# Patient Record
Sex: Female | Born: 1975 | Race: Black or African American | Hispanic: Yes | Marital: Married | Smoking: Former smoker
Health system: Southern US, Community
[De-identification: ages and names within clinical notes are randomized; demographics above are authoritative.]

## PROBLEM LIST (undated history)

## (undated) DIAGNOSIS — F419 Anxiety disorder, unspecified: Secondary | ICD-10-CM

## (undated) DIAGNOSIS — M51369 Other intervertebral disc degeneration, lumbar region without mention of lumbar back pain or lower extremity pain: Secondary | ICD-10-CM

## (undated) DIAGNOSIS — E559 Vitamin D deficiency, unspecified: Secondary | ICD-10-CM

## (undated) DIAGNOSIS — T7840XA Allergy, unspecified, initial encounter: Secondary | ICD-10-CM

## (undated) DIAGNOSIS — M1990 Unspecified osteoarthritis, unspecified site: Secondary | ICD-10-CM

## (undated) DIAGNOSIS — J45909 Unspecified asthma, uncomplicated: Secondary | ICD-10-CM

## (undated) DIAGNOSIS — R32 Unspecified urinary incontinence: Secondary | ICD-10-CM

## (undated) DIAGNOSIS — A64 Unspecified sexually transmitted disease: Secondary | ICD-10-CM

## (undated) DIAGNOSIS — F32A Depression, unspecified: Secondary | ICD-10-CM

## (undated) DIAGNOSIS — Z8742 Personal history of other diseases of the female genital tract: Secondary | ICD-10-CM

## (undated) DIAGNOSIS — K219 Gastro-esophageal reflux disease without esophagitis: Secondary | ICD-10-CM

## (undated) DIAGNOSIS — M5136 Other intervertebral disc degeneration, lumbar region: Secondary | ICD-10-CM

## (undated) HISTORY — DX: Unspecified urinary incontinence: R32

## (undated) HISTORY — PX: REDUCTION MAMMAPLASTY: SUR839

## (undated) HISTORY — DX: Personal history of other diseases of the female genital tract: Z87.42

## (undated) HISTORY — PX: COSMETIC SURGERY: SHX468

## (undated) HISTORY — DX: Vitamin D deficiency, unspecified: E55.9

## (undated) HISTORY — DX: Unspecified sexually transmitted disease: A64

## (undated) HISTORY — DX: Other intervertebral disc degeneration, lumbar region without mention of lumbar back pain or lower extremity pain: M51.369

## (undated) HISTORY — DX: Unspecified asthma, uncomplicated: J45.909

## (undated) HISTORY — DX: Anxiety disorder, unspecified: F41.9

## (undated) HISTORY — DX: Unspecified osteoarthritis, unspecified site: M19.90

## (undated) HISTORY — DX: Other intervertebral disc degeneration, lumbar region: M51.36

## (undated) HISTORY — DX: Depression, unspecified: F32.A

## (undated) HISTORY — PX: BREAST SURGERY: SHX581

## (undated) HISTORY — DX: Gastro-esophageal reflux disease without esophagitis: K21.9

---

## 1993-07-17 DIAGNOSIS — A64 Unspecified sexually transmitted disease: Secondary | ICD-10-CM

## 1993-07-17 HISTORY — DX: Unspecified sexually transmitted disease: A64

## 2003-07-18 HISTORY — PX: INDUCED ABORTION: SHX677

## 2011-07-18 HISTORY — PX: OTHER SURGICAL HISTORY: SHX169

## 2011-08-29 ENCOUNTER — Emergency Department (HOSPITAL_COMMUNITY)
Admission: EM | Admit: 2011-08-29 | Discharge: 2011-08-30 | Disposition: A | Discharge status: Home / Self Care | Payer: Self-pay | Attending: Emergency Medicine | Admitting: Emergency Medicine

## 2011-08-29 ENCOUNTER — Encounter (HOSPITAL_COMMUNITY): Payer: Self-pay | Admitting: *Deleted

## 2011-08-29 ENCOUNTER — Emergency Department (HOSPITAL_COMMUNITY)
Admission: EM | Admit: 2011-08-29 | Discharge: 2011-08-29 | Discharge status: Against Medical Advice | Payer: Self-pay | Attending: Emergency Medicine | Admitting: Emergency Medicine

## 2011-08-29 ENCOUNTER — Emergency Department (HOSPITAL_COMMUNITY): Payer: Self-pay

## 2011-08-29 DIAGNOSIS — W260XXA Contact with knife, initial encounter: Secondary | ICD-10-CM | POA: Insufficient documentation

## 2011-08-29 DIAGNOSIS — S61209A Unspecified open wound of unspecified finger without damage to nail, initial encounter: Secondary | ICD-10-CM | POA: Insufficient documentation

## 2011-08-29 DIAGNOSIS — Y93G1 Activity, food preparation and clean up: Secondary | ICD-10-CM | POA: Insufficient documentation

## 2011-08-29 DIAGNOSIS — Y92009 Unspecified place in unspecified non-institutional (private) residence as the place of occurrence of the external cause: Secondary | ICD-10-CM | POA: Insufficient documentation

## 2011-08-29 DIAGNOSIS — S61419A Laceration without foreign body of unspecified hand, initial encounter: Secondary | ICD-10-CM

## 2011-08-29 DIAGNOSIS — W261XXA Contact with sword or dagger, initial encounter: Secondary | ICD-10-CM | POA: Insufficient documentation

## 2011-08-29 DIAGNOSIS — S61409A Unspecified open wound of unspecified hand, initial encounter: Secondary | ICD-10-CM | POA: Insufficient documentation

## 2011-08-29 MED ORDER — ONDANSETRON HCL 4 MG/2ML IJ SOLN
4.0000 mg | Freq: Once | INTRAMUSCULAR | Status: AC
  Administered 2011-08-29: 4 mg via INTRAVENOUS

## 2011-08-29 MED ORDER — LIDOCAINE-EPINEPHRINE (PF) 1 %-1:200000 IJ SOLN
INTRAMUSCULAR | Status: AC
  Filled 2011-08-29: qty 10

## 2011-08-29 MED ORDER — HYDROMORPHONE HCL PF 1 MG/ML IJ SOLN
1.0000 mg | Freq: Once | INTRAMUSCULAR | Status: AC
  Administered 2011-08-29: 1 mg via INTRAVENOUS
  Filled 2011-08-29: qty 1

## 2011-08-29 MED ORDER — ONDANSETRON HCL 4 MG/2ML IJ SOLN
INTRAMUSCULAR | Status: AC
  Filled 2011-08-29: qty 2

## 2011-08-29 MED ORDER — SODIUM CHLORIDE 0.9 % IV SOLN
Freq: Once | INTRAVENOUS | Status: AC
  Administered 2011-08-29: 22:00:00 via INTRAVENOUS

## 2011-08-29 NOTE — ED Notes (Signed)
Bed:WA23<BR> Expected date:<BR> Expected time:<BR> Means of arrival:<BR> Comments:<BR> Hold for triage

## 2011-08-29 NOTE — ED Notes (Signed)
Lac to the lt ring finger with a knife while cooking dinner.  Minimal bleeding at present

## 2011-08-29 NOTE — ED Notes (Signed)
Pt in c/o laceration to left hand, pt with saturated bandage, laceration to left ring finger, dressing changed in triage with pressure applied, IV initiated, with NP at bedside

## 2011-08-30 LAB — BASIC METABOLIC PANEL
BUN: 13 mg/dL (ref 6–23)
CO2: 24 mEq/L (ref 19–32)
Calcium: 9 mg/dL (ref 8.4–10.5)
Chloride: 102 mEq/L (ref 96–112)
Creatinine, Ser: 0.51 mg/dL (ref 0.50–1.10)
GFR calc Af Amer: >90 mL/min (ref >90)
GFR calc non Af Amer: >90 mL/min (ref >90)
Glucose, Bld: 150 mg/dL — ABNORMAL HIGH (ref 70–99)
Potassium: 4 mEq/L (ref 3.5–5.1)
Sodium: 134 mEq/L — ABNORMAL LOW (ref 135–145)

## 2011-08-30 LAB — CBC
HCT: 37.2 % (ref 36.0–46.0)
Hemoglobin: 13.1 g/dL (ref 12.0–15.0)
MCH: 32.9 pg (ref 26.0–34.0)
MCHC: 35.2 g/dL (ref 30.0–36.0)
MCV: 93.5 fL (ref 78.0–100.0)
Platelets: 203 10*3/uL (ref 150–400)
RBC: 3.98 MIL/uL (ref 3.87–5.11)
RDW: 12 % (ref 11.5–15.5)
WBC: 9.6 10*3/uL (ref 4.0–10.5)

## 2011-08-30 LAB — DIFFERENTIAL
Basophils Absolute: 0.1 10*3/uL (ref 0.0–0.1)
Basophils Relative: 1 % (ref 0–1)
Eosinophils Absolute: 0.6 10*3/uL (ref 0.0–0.7)
Eosinophils Relative: 6 % — ABNORMAL HIGH (ref 0–5)
Lymphocytes Relative: 41 % (ref 12–46)
Lymphs Abs: 3.9 10*3/uL (ref 0.7–4.0)
Monocytes Absolute: 0.7 10*3/uL (ref 0.1–1.0)
Monocytes Relative: 7 % (ref 3–12)
Neutro Abs: 4.4 10*3/uL (ref 1.7–7.7)
Neutrophils Relative %: 46 % (ref 43–77)

## 2011-08-30 MED ORDER — OXYCODONE HCL 5 MG PO CAPS: 10.0000 mg | ORAL_CAPSULE | ORAL | Status: DC | PRN

## 2011-08-30 MED ORDER — TETANUS-DIPHTH-ACELL PERTUSSIS 5-2.5-18.5 LF-MCG/0.5 IM SUSP
INTRAMUSCULAR | Status: AC
  Administered 2011-08-30: 0.5 mL via INTRAMUSCULAR
  Filled 2011-08-30: qty 0.5

## 2011-08-30 MED ORDER — CEFAZOLIN SODIUM 1-5 GM-% IV SOLN
1.0000 g | Freq: Once | INTRAVENOUS | Status: AC
  Administered 2011-08-30: 1 g via INTRAVENOUS
  Filled 2011-08-30: qty 50

## 2011-08-30 MED ORDER — CEPHALEXIN 500 MG PO CAPS: 500.0000 mg | ORAL_CAPSULE | Freq: Four times a day (QID) | ORAL | Status: AC

## 2011-08-30 MED ORDER — TETANUS-DIPHTH-ACELL PERTUSSIS 5-2.5-18.5 LF-MCG/0.5 IM SUSP: 0.5000 mL | Freq: Once | INTRAMUSCULAR | Status: DC

## 2011-08-30 NOTE — ED Provider Notes (Signed)
History     CSN: [id number]  Arrival date & time 08/29/11  2134   First MD Initiated Contact with Patient 08/29/11 2154      Chief Complaint  Patient presents with  . Extremity Laceration    HPI: Patient is a 36 y.o. female presenting with skin laceration. The history is provided by the patient.  Laceration  The incident occurred 1 to 2 hours ago. The laceration is located on the left hand. The laceration is 2 cm in size. The laceration mechanism was a a clean knife. The pain is at a severity of 9/10. The pain is severe. The pain has been constant since onset.  Pt states she was cutting vegetables with a very sharp at home when the knife slipped and she cut her hand between her 3rd and 4th (L) fingers. Bleeding has been persistent.  History reviewed. No pertinent past medical history.  History reviewed. No pertinent past surgical history.  History reviewed. No pertinent family history.  History  Substance Use Topics  . Smoking status: Never Smoker   . Smokeless tobacco: Not on file  . Alcohol Use: No    OB History    Grav Para Term Preterm Abortions TAB SAB Ect Mult Living                  Review of Systems  Constitutional: Negative.   HENT: Negative.   Eyes: Negative.   Respiratory: Negative.   Cardiovascular: Negative.   Gastrointestinal: Negative.   Genitourinary: Negative.   Musculoskeletal: Negative.   Skin: Negative.   Neurological: Negative.   Hematological: Negative.   Psychiatric/Behavioral: Negative.     Allergies  Review of patient's allergies indicates no known allergies.  Home Medications   Current Outpatient Rx  Name Route Sig Dispense Refill  . EVENING PRIMROSE OIL PO CAPS Oral Take 1 capsule by mouth daily.    Marland Kitchen FEXOFENADINE HCL 180 MG PO TABS Oral Take 180 mg by mouth daily.    . MELOXICAM 7.5 MG PO TABS Oral Take 7.5 mg by mouth daily.      BP 106/71  Pulse 65  Temp(Src) 99.2 F (37.3 C) (Oral)  Resp 16  SpO2 100%  LMP  08/19/2011  Physical Exam  Constitutional: She is oriented to person, place, and time. She appears well-developed and well-nourished.  HENT:  Head: Normocephalic and atraumatic.  Eyes: Conjunctivae are normal.  Neck: Neck supple.  Cardiovascular: Normal rate and regular rhythm.   Pulmonary/Chest: Effort normal and breath sounds normal.  Abdominal: Soft. Bowel sounds are normal.  Musculoskeletal: Normal range of motion.       Hands:      Deep laceration at base of proximal (L) 4th finger w/ small persistent arterial bleed originating from proximal aspect of the wound. Pt flexes and extends finger well.   Neurological: She is alert and oriented to person, place, and time.  Skin: Skin is warm and dry. No erythema.  Psychiatric: She has a normal mood and affect.    ED Course  Procedures  Pressure dressing reapplied. Will xray given depth of wound. Pt has been medicated for pain.   Xray negative for fx or fb. Assessed wound to prepare for suturing. Small arterial bleed still noted. Discussed pt w/ Dr Manus Gunning who has also seen and examined pt. Dr Manus Gunning will consult with hand regarding plan 0130: 3rd pressure dressing removed, bleeding has slowed but continues. Per Dr Carlos Levering request saline soaked guaze applied to wound then secured w/  2 inch ace wrap.  0200:  Dr Amanda Pea is in the department ro eval pt. . Has discussed pt w/ Dr Manus Gunning.    Labs Reviewed - No data to display Dg Hand Complete Left  08/29/2011  *RADIOLOGY REPORT*  Clinical Data: Laceration to left ring finger.  Heavy bleeding.  LEFT HAND - COMPLETE 3+ VIEW  Comparison: None.  Findings: Soft tissue laceration demonstrated with gauze over the base of the left fourth finger.  No associated bony injuries demonstrated.  No evidence of acute fracture or subluxation.  No focal periosteal reaction.  Bone cortex and trabecular architecture appear intact.  No radiopaque foreign bodies demonstrated although smaller foreign bodies could  be obscured by the gauze.  Left hand is otherwise unremarkable.  IMPRESSION: Soft tissue laceration to the proximal aspect of the left fourth finger.  No acute bony injury demonstrated.  Original Report Authenticated By: Marlon Pel, M.D.     No diagnosis found.    MDM  HPI/PE and clinical findings c/w Laceration to proximal aspect of 4th digit w/ possible vascular/nerve compromise Dr Amanda Pea in to eval pt        Leanne Chang, NP 08/31/11 1042

## 2011-08-30 NOTE — Discharge Summary (Signed)
  See consult note Tamberlyn Midgley MD 

## 2011-08-30 NOTE — Consult Note (Signed)
Reason for Consult laceration left ring finger  Referring Physician: ED Staff  Michelle Beltran is an 37 y.o. female.  HPI: 36 year old female status post laceration left ring finger base concerns. I was asked to see and treat her for her upper extremity predicament. The injury occurred at 7:30 PM. I was asked to see her after  1:30 a.Beltran. 08/30/2011. Notes no pain. She has been given a digital block. I discussed this with emergency room staff and they noted that she had some numbness in her ulna digital nerve. Marland Kitchen.Patient presents for evaluation and treatment of the of their upper extremity predicament. The patient denies neck back chest or of abdominal pain. The patient notes that they have no lower extremity problems. The patient from primarily complains of the upper extremity pain noted.  History reviewed. No pertinent past medical history.  History reviewed. No pertinent past surgical history.  History reviewed. No pertinent family history.  Social History:  reports that she has never smoked. She does not have any smokeless tobacco history on file. She reports that she does not drink alcohol. Her drug history not on file.  Allergies: No Known Allergies  Medications: I have reviewed the patient's current medications.  Results for orders placed during the hospital encounter of 08/29/11 (from the past 48 hour(s))  CBC     Status: Normal   Collection Time   08/29/11  9:45 PM      Component Value Range Comment   WBC 9.6  4.0 - 10.5 (K/uL)    RBC 3.98  3.87 - 5.11 (MIL/uL)    Hemoglobin 13.1  12.0 - 15.0 (g/dL)    HCT 16.1  09.6 - 04.5 (%)    MCV 93.5  78.0 - 100.0 (fL)    MCH 32.9  26.0 - 34.0 (pg)    MCHC 35.2  30.0 - 36.0 (g/dL)    RDW 40.9  81.1 - 91.4 (%)    Platelets 203  150 - 400 (K/uL)   DIFFERENTIAL     Status: Abnormal   Collection Time   08/29/11  9:45 PM      Component Value Range Comment   Neutrophils Relative 46  43 - 77 (%)    Neutro Abs 4.4  1.7 - 7.7 (K/uL)    Lymphocytes Relative 41  12 - 46 (%)    Lymphs Abs 3.9  0.7 - 4.0 (K/uL)    Monocytes Relative 7  3 - 12 (%)    Monocytes Absolute 0.7  0.1 - 1.0 (K/uL)    Eosinophils Relative 6 (*) 0 - 5 (%)    Eosinophils Absolute 0.6  0.0 - 0.7 (K/uL)    Basophils Relative 1  0 - 1 (%)    Basophils Absolute 0.1  0.0 - 0.1 (K/uL)     Dg Hand Complete Left  08/29/2011  *RADIOLOGY REPORT*  Clinical Data: Laceration to left ring finger.  Heavy bleeding.  LEFT HAND - COMPLETE 3+ VIEW  Comparison: None.  Findings: Soft tissue laceration demonstrated with gauze over the base of the left fourth finger.  No associated bony injuries demonstrated.  No evidence of acute fracture or subluxation.  No focal periosteal reaction.  Bone cortex and trabecular architecture appear intact.  No radiopaque foreign bodies demonstrated although smaller foreign bodies could be obscured by the gauze.  Left hand is otherwise unremarkable.  IMPRESSION: Soft tissue laceration to the proximal aspect of the left fourth finger.  No acute bony injury demonstrated.  Original Report Authenticated By:  Marlon Pel, Beltran.D.    Review of Systems  Constitutional: Negative.   HENT: Negative.   Respiratory: Negative.   Cardiovascular: Negative.   Genitourinary: Negative.   Neurological: Negative.   Psychiatric/Behavioral: Negative.    Blood pressure 106/71, pulse 65, temperature 99.2 F (37.3 C), temperature source Oral, resp. rate 16, last menstrual period 08/19/2011, SpO2 100.00%. Physical Exam: The patient has a laceration at the base of the left ring finger with intact flexor tendon function and normal refill to the tip of the finger. Marland Kitchen.The patient is alert and oriented in no acute distress the patient complains of pain in the affected upper extremity. The patient is noted to have a normal HEENT exam. Lung fields show equal chest expansion and no shortness of breath abdomen exam is nontender without distention. Lower extremity examination  does not show any fracture dislocation or blood clot symptoms. Pelvis is stable neck and back are stable and nontender  Assessment/Plan:Marland Kitchen.We are planning surgery for your upper extremity. The risk and benefits of surgery include risk of bleeding infection anesthesia damage to normal structures and failure of the surgery to accomplish its intended goals of relieving symptoms and restoring function with this in mind we'll going to proceed. I have specifically discussed with the patient the pre-and postoperative regime and the does and don'ts and risk and benefits in great detail. Risk and benefits of surgery also include risk of dystrophy chronic nerve pain failure of the healing process to go onto completion and other inherent risks of surgery The relavent the pathophysiology of the disease/injury process, as well as the alternatives for treatment and postoperative course of action has been discussed in great detail with the patient who desires to proceed.  We will do everything in our power to help you (the patient) restore function to the upper extremity. Is a pleasure to see this patient today.  We have discussed with the patient all issues. Was taken and underwent irrigation and debridement on followed by of the wound. I discussed with her that I would recommend repeat examination so that I can ascertain whether she has a complete digital nerve the nerve is lacerated I would recommend formal operative reconstruction to prevent a painful neuroma in the future. Yesterday with her these issues at length and with her husband. Plans to discharge her on antibiotics and pain medicine. I will see her back in the office in 48 hours for reexamination. At that time if her nerve he is lacerated recommend formal repair in the operative arena. I discussed with her the risk and benefits of this and timeframe duration of recovery. I discussed with her that I would recommend repair to prevent a painful neuroma which can be  problematic into the future. Stands his. She tolerated the irrigation and debridement nicely today and will closure. She had excellent refill no complicating features. I will see back in my office in less than 48 hours and I would like to personally examined her finger for a look at the ulnar digital nerve in question.  Michelle Beltran,Michelle Beltran 08/30/2011, 2:26 AM

## 2011-08-30 NOTE — Discharge Instructions (Signed)

## 2011-08-31 NOTE — ED Provider Notes (Signed)
Medical screening examination/treatment/procedure(s) were conducted as a shared visit with non-physician practitioner(s) and myself.  I personally evaluated the patient during the encounter  Transverse laceration to base of fourth digit on left.  FDS and FDP intact, held in flexed position. +2 radial pulse, cap refill less than 2 secs.   Active arterial bleeding proximal to laceration in palm of hand.  Unable to visualize source. Bleeding continues despite pressure.  D/w hand surgeon Dr. Amanda Pea.  Glynn Octave, MD 08/31/11 [phone]

## 2011-09-01 ENCOUNTER — Encounter (HOSPITAL_COMMUNITY): Payer: Self-pay | Admitting: Pharmacy Technician

## 2011-09-05 ENCOUNTER — Encounter (HOSPITAL_COMMUNITY): Admission: RE | Payer: Self-pay | Source: Ambulatory Visit

## 2011-09-05 ENCOUNTER — Ambulatory Visit (HOSPITAL_COMMUNITY): Admission: RE | Admit: 2011-09-05 | Payer: Self-pay | Source: Ambulatory Visit | Admitting: Orthopedic Surgery

## 2011-09-05 SURGERY — IRRIGATION AND DEBRIDEMENT EXTREMITY
Anesthesia: General | Laterality: Left

## 2012-06-07 ENCOUNTER — Emergency Department (HOSPITAL_COMMUNITY)
Admission: EM | Admit: 2012-06-07 | Discharge: 2012-06-07 | Disposition: A | Discharge status: Home / Self Care | Payer: Self-pay | Attending: Emergency Medicine | Admitting: Emergency Medicine

## 2012-06-07 ENCOUNTER — Encounter (HOSPITAL_COMMUNITY): Payer: Self-pay | Admitting: *Deleted

## 2012-06-07 DIAGNOSIS — T7840XA Allergy, unspecified, initial encounter: Secondary | ICD-10-CM

## 2012-06-07 DIAGNOSIS — T498X5A Adverse effect of other topical agents, initial encounter: Secondary | ICD-10-CM | POA: Insufficient documentation

## 2012-06-07 DIAGNOSIS — J45901 Unspecified asthma with (acute) exacerbation: Secondary | ICD-10-CM

## 2012-06-07 DIAGNOSIS — Z79899 Other long term (current) drug therapy: Secondary | ICD-10-CM | POA: Insufficient documentation

## 2012-06-07 HISTORY — DX: Allergy, unspecified, initial encounter: T78.40XA

## 2012-06-07 MED ORDER — ALBUTEROL SULFATE HFA 108 (90 BASE) MCG/ACT IN AERS: 2.0000 | INHALATION_SPRAY | RESPIRATORY_TRACT | Status: DC | PRN

## 2012-06-07 MED ORDER — ALBUTEROL SULFATE (5 MG/ML) 0.5% IN NEBU
5.0000 mg | INHALATION_SOLUTION | Freq: Once | RESPIRATORY_TRACT | Status: AC
  Administered 2012-06-07: 5 mg via RESPIRATORY_TRACT
  Filled 2012-06-07: qty 1

## 2012-06-07 MED ORDER — EPINEPHRINE 0.3 MG/0.3ML IJ DEVI: 0.3000 mg | INTRAMUSCULAR | Status: DC | PRN

## 2012-06-07 MED ORDER — FAMOTIDINE 20 MG PO TABS: 40.0000 mg | ORAL_TABLET | Freq: Two times a day (BID) | ORAL | Status: DC

## 2012-06-07 MED ORDER — IPRATROPIUM BROMIDE 0.02 % IN SOLN
0.5000 mg | Freq: Once | RESPIRATORY_TRACT | Status: AC
  Administered 2012-06-07: 0.5 mg via RESPIRATORY_TRACT
  Filled 2012-06-07: qty 2.5

## 2012-06-07 MED ORDER — PREDNISONE 20 MG PO TABS: ORAL_TABLET | ORAL | Status: DC

## 2012-06-07 NOTE — ED Notes (Addendum)
Per ems pt is from home. Alert and oriented x4, ambulatory. Pt reports 1445 pt started having wheezing, sob, hypotension, hives on face. Pt has hx of allergic reaction. ems believes pts family tried to use [facility], but ems unsure if pt actually received medication.  18 g R wrist. ems gave 0.3 epi, 125 mg solumedrol, 50 mg benadryl, 50 mg zantac, 5 albuterol.    Upon arrival to ED pt reported she needed to have a bowel movement. Stayed in bathroom for at least 5 min. Pt had bowel movement, but now reports "her intestines hurts" 3/10.  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 Pt is taking a weekly serum to build up immunity to allergies. Pt took serum today and then started having allergic reaction.

## 2012-06-07 NOTE — ED Provider Notes (Signed)
History     CSN: [id number]  Arrival date & time 06/07/12  1519   First MD Initiated Contact with Patient 06/07/12 1551      Chief Complaint  Patient presents with  . Allergic Reaction    (Consider location/radiation/quality/duration/timing/severity/associated sxs/prior treatment) HPI  Patient reports she's been getting allergy shots for the past 6-7 months and relates her dose was changed about 4 weeks ago. She relates she took her allergy shot today at 220 and immediately started feeling short of breath and having wheezing. She states she had midline chest pain that was described as burning that she's never had before. She denies any itching or rash, no swelling of her face or throat. She states her face felt hot and her arms felt hot. She states she's never had this happen before. She presents via EMS and she received epi 0.3 cc IM, Solu-Medrol 125 mg IV Benadryl 50 mg IV Zantac 50 mg IV and albuterol nebulizer 5 mg. She states currently she has mild shortness of breath and still has some wheezing.  She reports she has to use her inhaler at least weekly for wheezing.  Allergist Dr. Marily Memos at [facility]  Past Medical History  Diagnosis Date  . Allergy     dogs and cats    No past surgical history on file.  No family history on file.  History  Substance Use Topics  . Smoking status: yes  . Smokeless tobacco: Not on file  . Alcohol Use: No  college student  OB History    Grav Para Term Preterm Abortions TAB SAB Ect Mult Living                  Review of Systems  All other systems reviewed and are negative.    Allergies  Cat hair extract  Home Medications   Current Outpatient Rx  Name  Route  Sig  Dispense  Refill  . ALBUTEROL SULFATE HFA 108 (90 BASE) MCG/ACT IN AERS   Inhalation   Inhale 2 puffs into the lungs every 4 (four) hours as needed for wheezing.   1 Inhaler   0   . EPINEPHRINE 0.3 MG/0.3ML IJ DEVI   Intramuscular   Inject 0.3  mLs (0.3 mg total) into the muscle as needed.   2 Device   0     BP 123/79  Pulse 100  Temp 98.5 F (36.9 C) (Oral)  Resp 16  SpO2 96%  Vital signs normal except tachycardia   Physical Exam  Nursing note and vitals reviewed. Constitutional: She is oriented to person, place, and time. She appears well-developed and well-nourished.  Non-toxic appearance. She does not appear ill. No distress.       Pt stares at me  HENT:  Head: Normocephalic and atraumatic.  Right Ear: External ear normal.  Left Ear: External ear normal.  Nose: Nose normal. No mucosal edema or rhinorrhea.  Mouth/Throat: Oropharynx is clear and moist and mucous membranes are normal. No dental abscesses or uvula swelling.  Eyes: Conjunctivae normal and EOM are normal. Pupils are equal, round, and reactive to light.  Neck: Normal range of motion and full passive range of motion without pain. Neck supple.  Cardiovascular: Normal rate, regular rhythm and normal heart sounds.  Exam reveals no gallop and no friction rub.   No murmur heard. Pulmonary/Chest: Effort normal. No respiratory distress. She has wheezes. She has no rhonchi. She has no rales. She exhibits no tenderness and no crepitus.  Has faint diffuse expir wheezes, no retractions  Abdominal: Soft. Normal appearance and bowel sounds are normal. She exhibits no distension. There is no tenderness. There is no rebound and no guarding.  Musculoskeletal: Normal range of motion. She exhibits no edema and no tenderness.       Moves all extremities well.   Neurological: She is alert and oriented to person, place, and time. She has normal strength. No cranial nerve deficit.  Skin: Skin is warm and intact. No rash noted. She is diaphoretic. No erythema. No pallor.  Psychiatric: She has a normal mood and affect. Her speech is normal and behavior is normal. Her mood appears not anxious.    ED Course  Procedures (including critical care time)   Medications    ipratropium (ATROVENT) nebulizer solution 0.5 mg (0.5 mg Nebulization Given 06/07/12 1624)  albuterol (PROVENTIL) (5 MG/ML) 0.5% nebulizer solution 5 mg (5 mg Nebulization Given 06/07/12 1624)     17:10 pt is feeling better, states her wheezing is gone, on my exam there is good air movement and the wheezing is gone. We have discussed talking to her allergist before she takes her next allergy injection.    1. Allergic reaction   2. Acute asthma exacerbation    New Prescriptions   ALBUTEROL (PROVENTIL HFA;VENTOLIN HFA) 108 (90 BASE) MCG/ACT INHALER    Inhale 2 puffs into the lungs every 4 (four) hours as needed for wheezing.   EPINEPHRINE (EPIPEN) 0.3 MG/0.3 ML DEVI    Inject 0.3 mLs (0.3 mg total) into the muscle as needed.   FAMOTIDINE (PEPCID) 20 MG TABLET    Take 2 tablets (40 mg total) by mouth 2 (two) times daily.   PREDNISONE (DELTASONE) 20 MG TABLET    Take 3 po QD x 2d starting tomorrow, then 2 po QD x 3d then 1 po QD x 3d    Plan discharge  Devoria Albe, MD, Armando Gang    MDM          Ward Givens, MD 06/07/12 1723

## 2012-06-07 NOTE — ED Notes (Signed)
RT called for breathing treatment.

## 2012-06-07 NOTE — ED Notes (Signed)
Pt escorted to discharge window. Pt verbalized understanding discharge instructions. In no acute distress.  

## 2012-11-14 ENCOUNTER — Other Ambulatory Visit: Payer: Self-pay | Admitting: *Deleted

## 2012-11-14 DIAGNOSIS — N83209 Unspecified ovarian cyst, unspecified side: Secondary | ICD-10-CM

## 2012-11-18 ENCOUNTER — Other Ambulatory Visit: Payer: Self-pay

## 2015-02-09 ENCOUNTER — Encounter (HOSPITAL_COMMUNITY): Payer: Self-pay | Admitting: *Deleted

## 2016-04-17 ENCOUNTER — Ambulatory Visit (INDEPENDENT_AMBULATORY_CARE_PROVIDER_SITE_OTHER): Payer: BLUE CROSS/BLUE SHIELD | Admitting: Obstetrics and Gynecology

## 2016-04-17 ENCOUNTER — Encounter: Payer: Self-pay | Admitting: Obstetrics and Gynecology

## 2016-04-17 VITALS — BP 100/72 | HR 62 | Resp 14 | Ht 68.5 in | Wt 160.4 lb

## 2016-04-17 DIAGNOSIS — Z8742 Personal history of other diseases of the female genital tract: Secondary | ICD-10-CM

## 2016-04-17 DIAGNOSIS — Z01419 Encounter for gynecological examination (general) (routine) without abnormal findings: Secondary | ICD-10-CM

## 2016-04-17 LAB — TSH: TSH: 1.11 mIU/L

## 2016-04-17 LAB — COMPREHENSIVE METABOLIC PANEL
ALT: 6 U/L (ref 6–29)
AST: 15 U/L (ref 10–30)
Albumin: 4.1 g/dL (ref 3.6–5.1)
Alkaline Phosphatase: 59 U/L (ref 33–115)
BUN: 11 mg/dL (ref 7–25)
CO2: 24 mmol/L (ref 20–31)
Calcium: 9.3 mg/dL (ref 8.6–10.2)
Chloride: 104 mmol/L (ref 98–110)
Creat: 0.57 mg/dL (ref 0.50–1.10)
Glucose, Bld: 57 mg/dL — ABNORMAL LOW (ref 65–99)
Potassium: 4 mmol/L (ref 3.5–5.3)
Sodium: 138 mmol/L (ref 135–146)
Total Bilirubin: 0.5 mg/dL (ref 0.2–1.2)
Total Protein: 6.6 g/dL (ref 6.1–8.1)

## 2016-04-17 LAB — LIPID PANEL
Cholesterol: 173 mg/dL (ref 125–200)
HDL: 70 mg/dL (ref >=46)
LDL Cholesterol: 86 mg/dL (ref <130)
Total CHOL/HDL Ratio: 2.5 Ratio (ref <=5.0)
Triglycerides: 85 mg/dL (ref <150)
VLDL: 17 mg/dL (ref <30)

## 2016-04-17 LAB — CBC
HCT: 39 % (ref 35.0–45.0)
Hemoglobin: 13.1 g/dL (ref 11.7–15.5)
MCH: 32.4 pg (ref 27.0–33.0)
MCHC: 33.6 g/dL (ref 32.0–36.0)
MCV: 96.5 fL (ref 80.0–100.0)
MPV: 10.2 fL (ref 7.5–12.5)
Platelets: 217 10*3/uL (ref 140–400)
RBC: 4.04 MIL/uL (ref 3.80–5.10)
RDW: 13.6 % (ref 11.0–15.0)
WBC: 7.4 10*3/uL (ref 3.8–10.8)

## 2016-04-17 MED ORDER — NORETHINDRONE 0.35 MG PO TABS: 1.0000 | ORAL_TABLET | Freq: Every day | ORAL | 0 refills | Status: DC

## 2016-04-17 NOTE — Progress Notes (Signed)
40 y.o. G38P0010 Married [place] female here for annual exam.    Requesting ultrasound due to hx of PCOS.  Wants to accompany this.  Menses regular.  No pain.   Asking about contraception.   Patient is an artist/librarian.  Studying at [employer].  Wants to do a masters. Husband is Optometrist.   PCP:  [employer].   LMP 04/05/16.  Confirmed with patient.  Period Cycle (Days): 30 Period Duration (Days): 4 Period Pattern: Regular Menstrual Flow: Moderate Menstrual Control: Maxi pad Menstrual Control Change Freq (Hours): every 4 hours on heaviest day Dysmenorrhea: (!) Mild Dysmenorrhea Symptoms: Cramping     Sexually active: Yes.    The current method of family planning is withdrawal.    Exercising: Yes.  Yoga Smoker:  Yes, smokes 1 cigaette/day  Health Maintenance: Pap:  2016 normal per patient.  Uncertain if did HPV testing.  History of abnormal Pap:  no MMG:  2015 normal per patient at ?[facility] Colonoscopy:  n/a BMD:   n/a  Result  n/a TDaP:  2014 Gardasil:   N/A   Screening Labs:  Hb today: 12.5, Urine today:  Neg.    reports that she has been smoking Cigarettes.  She has smoked for the past 20.00 years. She has never used smokeless tobacco. She reports that she drinks about 1.2 oz of alcohol per week . She reports that she uses drugs, including Marijuana.  Past Medical History:  Diagnosis Date  . Allergy    dogs and cats  . Anxiety   . Asthma   . History of PCOS   . STD (sexually transmitted disease) 1995   tx'd for gonorrhea  . Urinary incontinence     Past Surgical History:  Procedure Laterality Date  . BREAST SURGERY     Reduction    Current Outpatient Prescriptions  Medication Sig Dispense Refill  . albuterol (PROVENTIL HFA;VENTOLIN HFA) 108 (90 BASE) MCG/ACT inhaler Inhale 2 puffs into the lungs every 4 (four) hours as needed for wheezing. 1 Inhaler 0  . Evening Primrose Oil CAPS Take 1 capsule by mouth daily.    . fexofenadine (ALLEGRA) 180 MG  tablet Take 180 mg by mouth daily.     No current facility-administered medications for this visit.     Family History  Problem Relation Age of Onset  . Osteoporosis Mother   . Hypertension Mother   . Hyperlipidemia Mother   . Hypertension Father   . Hyperlipidemia Father     ROS:  Pertinent items are noted in HPI.  Otherwise, a comprehensive ROS was negative.  Exam:   BP 100/72 (BP Location: Right Arm, Patient Position: Sitting, Cuff Size: Normal)   Pulse 62   Resp 14   Ht 5' 8.5" (1.74 m)   Wt 160 lb 6.4 oz (72.8 kg)   LMP 04/14/2016 (Exact Date)   BMI 24.03 kg/m     General appearance: alert, cooperative and appears stated age Head: Normocephalic, without obvious abnormality, atraumatic Neck: no adenopathy, supple, symmetrical, trachea midline and thyroid normal to inspection and palpation Lungs: clear to auscultation bilaterally Breasts: normal appearance, no masses or tenderness, No nipple retraction or dimpling, No nipple discharge or bleeding, No axillary or supraclavicular adenopathy.  Consistent with reduction bilaterally.  Heart: regular rate and rhythm Abdomen: soft, non-tender; no masses, no organomegaly Extremities: extremities normal, atraumatic, no cyanosis or edema Skin: Skin color, texture, turgor normal. No rashes or lesions Lymph nodes: Cervical, supraclavicular, and axillary nodes normal. No abnormal inguinal nodes  palpated Neurologic: Grossly normal  Pelvic: External genitalia:  no lesions              Urethra:  normal appearing urethra with no masses, tenderness or lesions              Bartholins and Skenes: normal                 Vagina: normal appearing vagina with normal color and discharge, no lesions              Cervix: no lesions              Pap taken: Yes.   Bimanual Exam:  Uterus:  normal size, contour, position, consistency, mobility, non-tender.  Bimanual exam limited by inability to relax.               Adnexa: no mass, fullness,  tenderness              Rectal exam: Yes.  .  Confirms.              Anus:  normal sphincter tone, no lesions  Chaperone was present for exam.  Assessment:   Well woman visit with normal exam. PCOS.  Smoker.  Status post breast reduction.   Plan: Yearly mammogram recommended after age 60.  Patient will schedule at [facility].  Information given to her.  Recommended self breast exam.  Pap and HR HPV as above. Discussed Calcium, Vitamin D, regular exercise program including cardiovascular and weight bearing exercise. Routine labs.  Return for pelvic ultrasound.  Referred to [facility].  Discussed progestone contraception options - IUDs, Camilla, Nexplanon, Depo Provera.  Chooses Micronor.  Rx for 3 months to pharmacy. Instructed in use.  List of PCPs to patient.  Follow up annually and prn.       After visit summary provided.

## 2016-04-17 NOTE — Patient Instructions (Signed)

## 2016-04-18 ENCOUNTER — Other Ambulatory Visit: Payer: Self-pay | Admitting: *Deleted

## 2016-04-18 DIAGNOSIS — E559 Vitamin D deficiency, unspecified: Secondary | ICD-10-CM

## 2016-04-18 LAB — VITAMIN D 25 HYDROXY (VIT D DEFICIENCY, FRACTURES): Vit D, 25-Hydroxy: 14 ng/mL — ABNORMAL LOW (ref 30–100)

## 2016-04-18 MED ORDER — VITAMIN D (ERGOCALCIFEROL) 1.25 MG (50000 UNIT) PO CAPS: 50000.0000 [IU] | ORAL_CAPSULE | ORAL | Status: DC

## 2016-04-19 ENCOUNTER — Other Ambulatory Visit: Payer: Self-pay

## 2016-04-19 ENCOUNTER — Other Ambulatory Visit: Payer: Self-pay | Admitting: Obstetrics and Gynecology

## 2016-04-19 DIAGNOSIS — Z1231 Encounter for screening mammogram for malignant neoplasm of breast: Secondary | ICD-10-CM

## 2016-04-19 DIAGNOSIS — Z9889 Other specified postprocedural states: Secondary | ICD-10-CM

## 2016-04-20 ENCOUNTER — Telehealth: Payer: Self-pay | Admitting: Obstetrics and Gynecology

## 2016-04-20 LAB — IPS PAP TEST WITH HPV

## 2016-04-20 NOTE — Telephone Encounter (Signed)
Called patient to review benefits for a recommended procedure. Left Voicemail requesting a call back. °

## 2016-04-24 ENCOUNTER — Other Ambulatory Visit: Payer: Self-pay | Admitting: Obstetrics and Gynecology

## 2016-04-24 ENCOUNTER — Telehealth: Payer: Self-pay

## 2016-04-24 ENCOUNTER — Ambulatory Visit
Admission: RE | Admit: 2016-04-24 | Discharge: 2016-04-24 | Disposition: A | Discharge status: Home / Self Care | Payer: BLUE CROSS/BLUE SHIELD | Source: Ambulatory Visit | Attending: Obstetrics and Gynecology | Admitting: Obstetrics and Gynecology

## 2016-04-24 DIAGNOSIS — Z9889 Other specified postprocedural states: Secondary | ICD-10-CM

## 2016-04-24 DIAGNOSIS — Z1231 Encounter for screening mammogram for malignant neoplasm of breast: Secondary | ICD-10-CM

## 2016-04-24 NOTE — Telephone Encounter (Signed)
Spoke to pharmacy & they stated they have patients rx. Left message for patient letting her know. dpr signed.

## 2016-04-24 NOTE — Telephone Encounter (Signed)
Patient called to let the clinical staff know her pharmacy details because she said her pharmacy has not received her medications from our office. Details below:  Pharmacy: [facility] [phone] (phone)

## 2016-04-24 NOTE — Telephone Encounter (Signed)
It is fine to do the pelvic ultrasound in November.  You may close the encounter.

## 2016-04-24 NOTE — Telephone Encounter (Signed)
Spoke with patient regarding benefit for ultrasound. Patient understood and agreeable. Patient is scheduled 05/18/16 with Dr Quincy Simmonds. Patient declined earlier appointment dates due to other obligations. Patient is aware of arrival date, time and cancellation policy. No further questions.   Routing to Dr Quincy Simmonds for review

## 2016-04-24 NOTE — Telephone Encounter (Signed)
-----   Message from Nunzio Cobbs, MD sent at 04/20/2016  8:04 PM EDT ----- Results to patient through My Chart. Pap and HR HPV negative.  Recall - 02.   Hello Michelle Beltran,   Your pap test is normal and the high risk human papilloma virus test is negative.   I will see your back for your pelvic ultrasound visit.   It was really nice to meet you!  Josefa Half, MD

## 2016-04-24 NOTE — Telephone Encounter (Signed)
Left message to call Kaitlyn at [phone]. 

## 2016-04-25 ENCOUNTER — Telehealth: Payer: Self-pay | Admitting: Obstetrics and Gynecology

## 2016-04-25 MED ORDER — VITAMIN D (ERGOCALCIFEROL) 1.25 MG (50000 UNIT) PO CAPS: 50000.0000 [IU] | ORAL_CAPSULE | ORAL | 0 refills | Status: DC

## 2016-04-25 NOTE — Telephone Encounter (Signed)
Ok to close encounter. 

## 2016-04-25 NOTE — Telephone Encounter (Signed)
Spoke with patient. Advised of message as seen below from [place]. Patient is agreeable and verbalizes understanding. States her pharmacy has not received rx for Vitamin D. Rx for Vitamin D 50,000 IU q 7 days #12 0RF resent to pharmacy with confirmation. Patient is agreeable and will return call if she has any difficulty getting this prescription filled.  Routing to provider for final review. Patient agreeable to disposition. Will close encounter.

## 2016-05-03 NOTE — Telephone Encounter (Signed)
Erroneous encounter

## 2016-05-10 ENCOUNTER — Encounter: Payer: Self-pay | Admitting: Obstetrics and Gynecology

## 2016-05-18 ENCOUNTER — Other Ambulatory Visit: Payer: BLUE CROSS/BLUE SHIELD | Admitting: Obstetrics and Gynecology

## 2016-05-18 ENCOUNTER — Ambulatory Visit (INDEPENDENT_AMBULATORY_CARE_PROVIDER_SITE_OTHER): Payer: BLUE CROSS/BLUE SHIELD | Admitting: Obstetrics and Gynecology

## 2016-05-18 ENCOUNTER — Other Ambulatory Visit: Payer: BLUE CROSS/BLUE SHIELD

## 2016-05-18 ENCOUNTER — Encounter: Payer: Self-pay | Admitting: Obstetrics and Gynecology

## 2016-05-18 ENCOUNTER — Ambulatory Visit (INDEPENDENT_AMBULATORY_CARE_PROVIDER_SITE_OTHER): Payer: BLUE CROSS/BLUE SHIELD

## 2016-05-18 VITALS — BP 102/60 | HR 70 | Ht 68.5 in | Wt 158.0 lb

## 2016-05-18 DIAGNOSIS — D251 Intramural leiomyoma of uterus: Secondary | ICD-10-CM | POA: Diagnosis not present

## 2016-05-18 DIAGNOSIS — Z8742 Personal history of other diseases of the female genital tract: Secondary | ICD-10-CM

## 2016-05-18 DIAGNOSIS — E559 Vitamin D deficiency, unspecified: Secondary | ICD-10-CM

## 2016-05-18 NOTE — Progress Notes (Signed)
Patient ID: Michelle Beltran, female   DOB: 1976-06-04, 40 y.o.   MRN: [id number] GYNECOLOGY  VISIT   HPI: 40 y.o.   Married  [place]  female   [place] with Patient's last menstrual period was 05/14/2016 (exact date).   here for pelvic ultrasound for history of PCOS.    Patient is a smoker and recently given an Rx for Micronor for contraception so she will be using a progesterone form.   Having joint pains.  Being treated for vitamin D deficiency with weekly Vit D 50,000 IU.  Level was 14 on 04/16/16. States she is already feeling better after 3 doses.   GYNECOLOGIC HISTORY: Patient's last menstrual period was 05/14/2016 (exact date). Contraception: Withdrawal-- OCPs--Micronor(hasn't started yet) Menopausal hormone therapy:  n/a Last mammogram:  04-24-16 Density B/Neg/BiRads1: [facility] Last pap smear:  04-17-16 Neg:Neg HR HPV        OB History    Gravida Para Term Preterm AB Living   1 0 0 0 1     SAB TAB Ectopic Multiple Live Births   0 0 0             There are no active problems to display for this patient.   Past Medical History:  Diagnosis Date  . Allergy    dogs and cats  . Anxiety   . Asthma   . History of PCOS   . STD (sexually transmitted disease) 1995   tx'd for gonorrhea  . Urinary incontinence     Past Surgical History:  Procedure Laterality Date  . BREAST SURGERY     Reduction    Current Outpatient Prescriptions  Medication Sig Dispense Refill  . albuterol (PROVENTIL HFA;VENTOLIN HFA) 108 (90 BASE) MCG/ACT inhaler Inhale 2 puffs into the lungs every 4 (four) hours as needed for wheezing. 1 Inhaler 0  . Evening Primrose Oil CAPS Take 1 capsule by mouth daily.    . fexofenadine (ALLEGRA) 180 MG tablet Take 180 mg by mouth daily.    . Vitamin D, Ergocalciferol, (DRISDOL) 50000 units CAPS capsule Take 1 capsule (50,000 Units total) by mouth every 7 (seven) days. 12 capsule 0  . norethindrone (MICRONOR,CAMILA,ERRIN) 0.35 MG tablet  Take 1 tablet (0.35 mg total) by mouth daily. (Patient not taking: Reported on 05/18/2016) 3 Package 0   No current facility-administered medications for this visit.      ALLERGIES: Cat hair extract  Family History  Problem Relation Age of Onset  . Osteoporosis Mother   . Hypertension Mother   . Hyperlipidemia Mother   . Hypertension Father   . Hyperlipidemia Father   . Heart disease Paternal Grandmother   . Heart disease Paternal Grandfather     Social History   Social History  . Marital status: Married    Spouse name: N/A  . Number of children: N/A  . Years of education: N/A   Occupational History  . Not on file.   Social History Main Topics  . Smoking status: Light Tobacco Smoker    Years: 20.00    Types: Cigarettes  . Smokeless tobacco: Never Used  . Alcohol use 1.2 oz/week    2 Glasses of wine per week  . Drug use:     Types: Marijuana     Comment: occ.  Marland Kitchen Sexual activity: Yes    Partners: Male    Birth control/ protection: None   Other Topics Concern  . Not on file   Social History Narrative   **  Merged History Encounter **        ROS:  Pertinent items are noted in HPI.  PHYSICAL EXAMINATION:    BP 102/60 (BP Location: Right Arm, Patient Position: Sitting, Cuff Size: Normal)   Pulse 70   Ht 5' 8.5" (1.74 m)   Wt 158 lb (71.7 kg)   LMP 05/14/2016 (Exact Date)   BMI 23.67 kg/m     General appearance: alert, cooperative and appears stated age   Uterus - 0.7 mm intramural fibroid. EMS 4.27 mm. Ovaries with no signs of PCOS. Bilateral follicles.  No free fluid.  ASSESSMENT  Hx PCOS.  No signs of this today.  Small myoma. Smoker.  Joint pain.  Vit D deficiency.   PLAN  Discussion of ultrasound findings and lack of PCOS pattern.  Education about myomas in verbal and written form from [employer].  Start [place] with next menses.  Instructed in use.  Continue vit D and recheck in 2 months in office.  Follow up with PCP if joint pain does not  improved with vit D tx.   An After Visit Summary was printed and given to the patient.  __15____ minutes face to face time of which over 50% was spent in counseling.

## 2016-07-20 ENCOUNTER — Other Ambulatory Visit: Payer: Self-pay

## 2016-07-21 ENCOUNTER — Ambulatory Visit (INDEPENDENT_AMBULATORY_CARE_PROVIDER_SITE_OTHER): Payer: BLUE CROSS/BLUE SHIELD | Admitting: Obstetrics and Gynecology

## 2016-07-21 ENCOUNTER — Encounter: Payer: Self-pay | Admitting: Obstetrics and Gynecology

## 2016-07-21 VITALS — BP 110/64 | HR 70 | Ht 68.5 in | Wt 162.6 lb

## 2016-07-21 DIAGNOSIS — Z3009 Encounter for other general counseling and advice on contraception: Secondary | ICD-10-CM

## 2016-07-21 DIAGNOSIS — E559 Vitamin D deficiency, unspecified: Secondary | ICD-10-CM

## 2016-07-21 MED ORDER — NORETHINDRONE 0.35 MG PO TABS: 1.0000 | ORAL_TABLET | Freq: Every day | ORAL | 2 refills | Status: DC

## 2016-07-21 NOTE — Patient Instructions (Addendum)
Consider Dr. Emi Belfast at [facility] for your primary care.

## 2016-07-21 NOTE — Progress Notes (Signed)
GYNECOLOGY  VISIT   HPI: 41 y.o.   Married  African American/[place]ian female   [id number] with Patient's last menstrual period was 07/15/2016 (exact date).   here for 2 month follow up after beginning Micronor and needs to recheck vitamin d level.   Vit D level drawn.   Started [place] after visit on 05/18/16. On her second pack. First menses 9 days.  Lingering menses.  Light flow.  Second menstrual cycle now.  Little cramping.  No missed pill.  Smoker.   Taking vit D.  Level was 14 on 04/17/16. Mood is improved. Did not take this week.   GYNECOLOGIC HISTORY: Patient's last menstrual period was 07/15/2016 (exact date). Contraception:  OCP--Micronor Menopausal hormone therapy:  n/a Last mammogram:  04-24-16 Hx.Br.reduction/Density B/Neg/BiRads1:TBC Last pap smear:   04-17-16 Neg:Neg HR HPV        OB History    Gravida Para Term Preterm AB Living   1 0 0 0 1     SAB TAB Ectopic Multiple Live Births   0 0 0             There are no active problems to display for this patient.   Past Medical History:  Diagnosis Date  . Allergy    dogs and cats  . Anxiety   . Asthma   . Degenerative disc disease, lumbar   . History of PCOS   . STD (sexually transmitted disease) 1995   tx'd for gonorrhea  . Urinary incontinence     Past Surgical History:  Procedure Laterality Date  . BREAST SURGERY     Reduction    Current Outpatient Prescriptions  Medication Sig Dispense Refill  . albuterol (PROVENTIL HFA;VENTOLIN HFA) 108 (90 BASE) MCG/ACT inhaler Inhale 2 puffs into the lungs every 4 (four) hours as needed for wheezing. 1 Inhaler 0  . Evening Primrose Oil CAPS Take 1 capsule by mouth daily.    . fexofenadine (ALLEGRA) 180 MG tablet Take 180 mg by mouth daily.    . norethindrone (MICRONOR,CAMILA,ERRIN) 0.35 MG tablet Take 1 tablet (0.35 mg total) by mouth daily. (Patient not taking: Reported on 05/18/2016) 3 Package 0  . Vitamin D, Ergocalciferol, (DRISDOL) 50000 units CAPS  capsule Take 1 capsule (50,000 Units total) by mouth every 7 (seven) days. 12 capsule 0   No current facility-administered medications for this visit.      ALLERGIES: Cat hair extract  Family History  Problem Relation Age of Onset  . Osteoporosis Mother   . Hypertension Mother   . Hyperlipidemia Mother   . Hypertension Father   . Hyperlipidemia Father   . Heart disease Paternal Grandmother   . Heart disease Paternal Grandfather     Social History   Social History  . Marital status: Married    Spouse name: N/A  . Number of children: N/A  . Years of education: N/A   Occupational History  . Not on file.   Social History Main Topics  . Smoking status: Light Tobacco Smoker    Years: 20.00    Types: Cigarettes  . Smokeless tobacco: Never Used  . Alcohol use 1.2 oz/week    2 Glasses of wine per week  . Drug use:     Types: Marijuana     Comment: occ.  Marland Kitchen Sexual activity: Yes    Partners: Male    Birth control/ protection: None   Other Topics Concern  . Not on file   Social History Narrative   ** Merged History  Encounter **        ROS:  Pertinent items are noted in HPI.  PHYSICAL EXAMINATION:    BP 110/64 (BP Location: Right Arm, Patient Position: Sitting, Cuff Size: Normal)   Pulse 70   Ht 5' 8.5" (1.74 m)   Wt 162 lb 9.6 oz (73.8 kg)   LMP 07/15/2016 (Exact Date)   BMI 24.36 kg/m     General appearance: alert, cooperative and appears stated age  ASSESSMENT  Vit D deficiency.  Need for contraception.   Smoker over age 55. Doing well on Micronor.   PLAN  Recheck Vit D level today.  May be able to switch to OTC vit D depending on the level.  Continue Micronor.  Follow up for annual exam and prn.    An After Visit Summary was printed and given to the patient.  __15____ minutes face to face time of which over 50% was spent in counseling.

## 2016-07-22 LAB — VITAMIN D 25 HYDROXY (VIT D DEFICIENCY, FRACTURES): Vit D, 25-Hydroxy: 32 ng/mL (ref 30–100)

## 2016-07-23 ENCOUNTER — Encounter: Payer: Self-pay | Admitting: Obstetrics and Gynecology

## 2017-06-21 ENCOUNTER — Ambulatory Visit: Payer: BLUE CROSS/BLUE SHIELD | Admitting: Obstetrics and Gynecology

## 2017-08-23 ENCOUNTER — Telehealth: Payer: Self-pay | Admitting: Obstetrics and Gynecology

## 2017-08-23 NOTE — Telephone Encounter (Signed)
Bleeding may be due to late pills.  I do recommend a urine pregnancy test.  She can stop pills for 7 days and have a cycle and then restart them.  She will need to use back up protection for pregnancy for at least one week after restarting the pills. Set cell phone to keep on time.  I will see her for her appointment.

## 2017-08-23 NOTE — Telephone Encounter (Signed)
Patient is having a cycle that is lasting longer than 10 days.

## 2017-08-23 NOTE — Telephone Encounter (Signed)
Spoke with patient, advised as seen below per Dr. Quincy Simmonds. Patient verbalizes understanding and is agreeable.   Routing to provider for final review. Patient is agreeable to disposition. Will close encounter.

## 2017-08-23 NOTE — Telephone Encounter (Signed)
Spoke with patient, patient requesting OV for menses lasting longer than 10 days.   On POP, "no missed pills, has taken late x2". Takes at night and has taken them in the morning when missed at night. Reviewed importance of taking POP same time daily, no missed pills.  Reports cycles have been regular until this month, is on day 12. Changing pad 2-3 times/day.   Reports breast tenderness. Denies pain, N/V, fever/chills.   Patient is SA.   OV scheduled for 08/28/17 at 9:30am with Dr. Quincy Simmonds. Patient asked what to do with continued bleeding? Advised should the bleeding become heavy, changing saturated pad q1 -2 hrs, sever pain or new symptoms develop, return call to office. If after hours, seek immediate care at [facility]. Advised Dr. Quincy Simmonds will review, Iw ill return call with any additional recommendations. Patient agreeable.   Dr. Quincy Simmonds -any additional recommendations?

## 2017-08-28 ENCOUNTER — Ambulatory Visit: Payer: BC Managed Care – PPO | Admitting: Obstetrics and Gynecology

## 2017-08-28 ENCOUNTER — Other Ambulatory Visit: Payer: Self-pay

## 2017-08-28 ENCOUNTER — Encounter: Payer: Self-pay | Admitting: Obstetrics and Gynecology

## 2017-08-28 VITALS — BP 108/60 | HR 72 | Resp 16 | Ht 68.5 in | Wt 172.0 lb

## 2017-08-28 DIAGNOSIS — B009 Herpesviral infection, unspecified: Secondary | ICD-10-CM | POA: Diagnosis not present

## 2017-08-28 DIAGNOSIS — N926 Irregular menstruation, unspecified: Secondary | ICD-10-CM | POA: Diagnosis not present

## 2017-08-28 LAB — POCT URINE PREGNANCY: Preg Test, Ur: NEGATIVE

## 2017-08-28 MED ORDER — VALACYCLOVIR HCL 1 G PO TABS: ORAL_TABLET | ORAL | 1 refills | Status: DC

## 2017-08-28 NOTE — Progress Notes (Signed)
GYNECOLOGY  VISIT   HPI: 42 y.o.   Married  [place]  female   Michelle Beltran with Patient's last menstrual period was 08/10/2017.   here for period lasted for 12 days. Patient would also like prescription for Valacylocir. Forgot her pill at least twice and took it 24 hours later and 12 hours later. Stopped Micronor upon my instruction.  Stopped bleeding.  No change in partner. No pelvic pain.  Pelvic US on 11/17 showing small 7 mm fibroid.  EMS normal.  Normal ovaries and no free fluid.   Smoker.   Having an oral herpes outbreak.  UPT negative.   GYNECOLOGIC HISTORY: Patient's last menstrual period was 08/10/2017. Contraception:  Micronor Menopausal hormone therapy:  none Last mammogram:  04-24-16   Hx.Br.reduction/Density B/Neg/BiRads1:TBC Last pap smear:   04-17-16 Neg:Neg HR HPV UPT: negative        OB History    Gravida Para Term Preterm AB Living   1 0 0 0 1     SAB TAB Ectopic Multiple Live Births   0 0 0             There are no active problems to display for this patient.   Past Medical History:  Diagnosis Date  . Allergy    dogs and cats  . Anxiety   . Asthma   . Degenerative disc disease, lumbar   . History of PCOS   . STD (sexually transmitted disease) 1995   tx'd for gonorrhea  . Urinary incontinence   . Vitamin D deficiency     Past Surgical History:  Procedure Laterality Date  . BREAST SURGERY     Reduction    Current Outpatient Medications  Medication Sig Dispense Refill  . albuterol (PROVENTIL HFA;VENTOLIN HFA) 108 (90 BASE) MCG/ACT inhaler Inhale 2 puffs into the lungs every 4 (four) hours as needed for wheezing. 1 Inhaler 0  . Evening Primrose Oil CAPS Take 1 capsule by mouth daily.    . fexofenadine (ALLEGRA) 180 MG tablet Take 180 mg by mouth daily.    . norethindrone (MICRONOR,CAMILA,ERRIN) 0.35 MG tablet Take 1 tablet (0.35 mg total) by mouth daily. 3 Package 2  . Vitamin D, Ergocalciferol, (DRISDOL) 50000 units CAPS capsule Take 1  capsule (50,000 Units total) by mouth every 7 (seven) days. 12 capsule 0   No current facility-administered medications for this visit.      ALLERGIES: Cat hair extract  Family History  Problem Relation Age of Onset  . Osteoporosis Mother   . Hypertension Mother   . Hyperlipidemia Mother   . Hypertension Father   . Hyperlipidemia Father   . Heart disease Paternal Grandmother   . Heart disease Paternal Grandfather     Social History   Socioeconomic History  . Marital status: Married    Spouse name: Not on file  . Number of children: Not on file  . Years of education: Not on file  . Highest education level: Not on file  Social Needs  . Financial resource strain: Not on file  . Food insecurity - worry: Not on file  . Food insecurity - inability: Not on file  . Transportation needs - medical: Not on file  . Transportation needs - non-medical: Not on file  Occupational History  . Not on file  Tobacco Use  . Smoking status: Light Tobacco Smoker    Years: 20.00    Types: Cigarettes  . Smokeless tobacco: Never Used  Substance and Sexual Activity  . Alcohol use:  Yes    Alcohol/week: 1.2 oz    Types: 2 Glasses of wine per week  . Drug use: Yes    Types: Marijuana    Comment: occ.  Marland Kitchen Sexual activity: Yes    Partners: Male    Birth control/protection: None  Other Topics Concern  . Not on file  Social History Narrative   ** Merged History Encounter **        ROS:  Pertinent items are noted in HPI.  PHYSICAL EXAMINATION:    BP 108/60 (BP Location: Right Arm, Patient Position: Sitting, Cuff Size: Normal)   Pulse 72   Resp 16   Ht 5' 8.5" (1.74 m)   Wt 172 lb (78 kg)   LMP 08/10/2017   BMI 25.77 kg/m     General appearance: alert, cooperative and appears stated age Head:  Oral herpes outbreak.   Pelvic: External genitalia:  no lesions              Urethra:  normal appearing urethra with no masses, tenderness or lesions              Bartholins and Skenes:  normal                 Vagina: normal appearing vagina with normal color and discharge, no lesions              Cervix: no lesions.  No CMT.                Bimanual Exam:  Uterus:  normal size, contour, position, consistency, mobility, non-tender              Adnexa: no mass, fullness, tenderness              Chaperone was present for exam.  ASSESSMENT  Breakthrough bleeding on Micronor due to skipped pills. Hx tiny fibroid. Oral HSV.  PLAN  Reinforced the importance of taking Micronor on time. Set cell phone or change time of day to take pill if needed.  Use back up for at least one week with restarting the pill. Risks and benefits reviewed. I discussed progesterone IUDs as an alternative.  Valtrex 2000 mg po bid x 24 hours prn.  #30, RF one.  Follow up prn.   An After Visit Summary was printed and given to the patient.  15______ minutes face to face time of which over 50% was spent in counseling.

## 2017-11-06 ENCOUNTER — Telehealth: Payer: Self-pay | Admitting: Obstetrics and Gynecology

## 2017-11-06 ENCOUNTER — Other Ambulatory Visit: Payer: Self-pay | Admitting: Obstetrics and Gynecology

## 2017-11-06 NOTE — Telephone Encounter (Signed)
Medication refill request: norlyda 0.35mg  Last AEX:  10/17 aex, pt seen for irregular menses twice since then Next AEX: 01/18/18 Last MMG (if hormonal medication request): 04-24-16 category b density birads 1:neg Refill authorized: please approve if appropriate

## 2017-11-06 NOTE — Telephone Encounter (Signed)
Patient's last MMG was 04/24/2016 Birads 1 negative. Last aex was 07/21/2016. Next annual exam scheduled for 01/18/2018.  Taking Micronor for contraception. Patient is out of POP. Routing to [place] for review as Dr.Silva is out of the office.   Cc: Dr.Silva

## 2017-11-06 NOTE — Telephone Encounter (Signed)
Patient needs refill on birth control, she is currently out. Just scheduled aex for 01/18/2018.

## 2017-11-06 NOTE — Telephone Encounter (Signed)
Medication refill request: OCP Last AEX:  07-21-16  Next AEX: 01-18-18 Last MMG (if hormonal medication request): 04-24-16 WNL  Refill authorized: please advise

## 2017-11-06 NOTE — Telephone Encounter (Signed)
[place] for [employer] for one month until she gets her mammogram scheduled.

## 2017-11-07 NOTE — Telephone Encounter (Signed)
Attempted to reach patient at number provided, there was no answer and recording states that the voicemail box is full.   Need to advise 1 month of POP has been sent to the pharmacy. Will need to get mammogram for further refills.

## 2017-11-07 NOTE — Telephone Encounter (Signed)
Patient checking on status of refill request.

## 2017-11-07 NOTE — Telephone Encounter (Signed)
Spoke with patient and advised 1 pack of OCPs sent to pharmacy yesterday but no further refills until she has mammogram. Patient voiced understanding and will call to schedule mammogram.

## 2017-11-07 NOTE — Telephone Encounter (Signed)
Pt notified by amanda, cma. See telephone encounter.

## 2017-11-07 NOTE — Telephone Encounter (Signed)
Rx sent in for 28 days yesterday. She is behind on her mammogram.  She needs to update this for further refills.

## 2018-01-18 ENCOUNTER — Encounter: Payer: Self-pay | Admitting: Obstetrics and Gynecology

## 2018-01-18 ENCOUNTER — Ambulatory Visit: Payer: BC Managed Care – PPO | Admitting: Obstetrics and Gynecology

## 2018-05-01 LAB — HM PAP SMEAR

## 2018-12-24 ENCOUNTER — Other Ambulatory Visit: Payer: Self-pay | Admitting: Obstetrics and Gynecology

## 2018-12-24 NOTE — Telephone Encounter (Signed)
Medication refill request: Valtrex Last OV:  08-28-17 BS Next AEX: attempted to reach patient to schedule aex- message left to return call Last MMG (if hormonal medication request): n/a Refill authorized: today, please advise.   Patient last seen 08-28-17, message left per DPR for patient to return call.   Routing to covering provider for Dr. Quincy Simmonds. Please advise on refill.

## 2018-12-24 NOTE — Telephone Encounter (Signed)
Patient states she is no longer seeing Dr. Quincy Simmonds and does not need refill.

## 2019-03-07 ENCOUNTER — Other Ambulatory Visit: Payer: Self-pay

## 2019-03-07 DIAGNOSIS — Z20822 Contact with and (suspected) exposure to covid-19: Secondary | ICD-10-CM

## 2019-03-08 LAB — NOVEL CORONAVIRUS, NAA: SARS-CoV-2, NAA: NOT DETECTED

## 2019-10-11 ENCOUNTER — Ambulatory Visit: Payer: BC Managed Care – PPO | Attending: Internal Medicine

## 2019-10-11 DIAGNOSIS — Z23 Encounter for immunization: Secondary | ICD-10-CM

## 2019-10-11 NOTE — Progress Notes (Signed)
   Covid-19 Vaccination Clinic  Name:  Michelle Beltran    MRN: [id number] DOB: 1976/03/31  10/11/2019  Ms. Damasceno- Willodean Rosenthal was observed post Covid-19 immunization for 15 minutes without incident. She was provided with Vaccine Information Sheet and instruction to access the V-Safe system.   Ms. Quincy Carnes- Willodean Rosenthal was instructed to call 911 with any severe reactions post vaccine: Marland Kitchen Difficulty breathing  . Swelling of face and throat  . A fast heartbeat  . A bad rash all over body  . Dizziness and weakness   Immunizations Administered    Name Date Dose VIS Date Route   Pfizer COVID-19 Vaccine 10/11/2019 11:20 AM 0.3 mL 06/27/2019 Intramuscular   Manufacturer: [place]   Lot: [id number]   [place]: [id number]

## 2019-11-04 ENCOUNTER — Ambulatory Visit: Payer: BC Managed Care – PPO | Attending: Internal Medicine

## 2019-11-04 DIAGNOSIS — Z23 Encounter for immunization: Secondary | ICD-10-CM

## 2019-11-04 NOTE — Progress Notes (Signed)
   Covid-19 Vaccination Clinic  Name:  Michelle Beltran    MRN: [id number] DOB: 06-05-1976  11/04/2019  Ms. Michelle Beltran was observed post Covid-19 immunization for 15 minutes without incident. She was provided with Vaccine Information Sheet and instruction to access the V-Safe system.   Ms. Michelle Beltran was instructed to call 911 with any severe reactions post vaccine: Marland Kitchen Difficulty breathing  . Swelling of face and throat  . A fast heartbeat  . A bad rash all over body  . Dizziness and weakness   Immunizations Administered    Name Date Dose VIS Date Route   Pfizer COVID-19 Vaccine 11/04/2019 10:41 AM 0.3 mL 09/10/2018 Intramuscular   Manufacturer: [place]   Lot: [id number]   [place]: [id number]

## 2019-11-14 NOTE — Progress Notes (Signed)
Office Visit Note  Patient: Michelle Beltran             Date of Birth: 11-01-1975           MRN: [id number]             PCP: Leeroy Cha, MD Referring: Verner Chol, MD Visit Date: 11/21/2019 Occupation: @GUAROCC @  Subjective:  Pain in both shoulders.   History of Present Illness: Michelle Beltran is a 44 y.o. female seen in consultation per request of Dr. Layne Benton.  According to patient in year 2000 she started having difficulty bending.  She was having lower back pain and she tried ice and rest but gradually her symptoms got worse.  She states in the last year she has been doing core strengthening exercises and yoga and her lower back pain has been better in the last 1 year.  She also works on the computer a lot and she has been having neck pain and discomfort.  She states she was seen by Dr. Layne Benton and did physical therapy which helped her neck pain.  She was also having some pain in her right scapula which improved after physical therapy.  She states now she has been having discomfort in her bilateral shoulders.  She has difficulty sleeping on the side of her shoulders.  She states her shoulders are sensitive to touch.  She also experiences "wet noise" in her left shoulder.  She has had x-rays by Dr. Layne Benton.  She states she is concerned because her parents have osteoarthritis.  She has occasional knee joint discomfort.  None of the other joints are painful.  Activities of Daily Living:  Patient reports morning stiffness for a few minutes.   Patient Reports nocturnal pain.  Difficulty dressing/grooming: Denies Difficulty climbing stairs: Denies Difficulty getting out of chair: Denies Difficulty using hands for taps, buttons, cutlery, and/or writing: Denies  Review of Systems  Constitutional: Positive for fatigue. Negative for night sweats, weight gain and weight loss.  HENT: Negative for mouth sores, trouble swallowing, trouble swallowing,  mouth dryness and nose dryness.   Eyes: Negative for pain, redness, itching, visual disturbance and dryness.  Respiratory: Negative for cough, shortness of breath and difficulty breathing.   Cardiovascular: Negative for chest pain, palpitations, hypertension, irregular heartbeat and swelling in legs/feet.  Gastrointestinal: Negative for blood in stool, constipation and diarrhea.  Endocrine: Negative for increased urination.  Genitourinary: Negative for difficulty urinating and vaginal dryness.  Musculoskeletal: Positive for arthralgias, joint pain, myalgias, morning stiffness, muscle tenderness and myalgias. Negative for joint swelling and muscle weakness.  Skin: Negative for color change, rash, hair loss, redness, skin tightness, ulcers and sensitivity to sunlight.  Allergic/Immunologic: Negative for susceptible to infections.  Neurological: Positive for numbness. Negative for dizziness, headaches, memory loss, night sweats and weakness.  Hematological: Negative for bruising/bleeding tendency and swollen glands.  Psychiatric/Behavioral: Positive for sleep disturbance. Negative for depressed mood and confusion. The patient is nervous/anxious.     PMFS History:  There are no problems to display for this patient.   Past Medical History:  Diagnosis Date  . Allergy    dogs and cats  . Anxiety   . Asthma   . Degenerative disc disease, lumbar   . History of PCOS   . STD (sexually transmitted disease) 1995   tx'd for gonorrhea  . Urinary incontinence   . Vitamin D deficiency     Family History  Problem Relation Age of Onset  . Osteoporosis  Mother   . Hypertension Mother   . Hyperlipidemia Mother   . Hypertension Father   . Hyperlipidemia Father   . Arthritis Father   . Heart disease Paternal Grandmother   . Heart disease Paternal Grandfather    Past Surgical History:  Procedure Laterality Date  . BREAST SURGERY     Reduction  . finger nerve reconstruction Left 2013   left  hand, 4th digit.  . INDUCED ABORTION  2005   Social History   Social History Narrative   ** Merged History Encounter **       Immunization History  Administered Date(s) Administered  . [employer] SARS-COV-2 Vaccination 10/11/2019, 11/04/2019  . Tdap 08/30/2011     Objective: Vital Signs: BP 105/74 (BP Location: Right Arm, Patient Position: Sitting, Cuff Size: Normal)   Pulse 77   Resp 14   Ht 5\' 9"  (1.753 m)   Wt 174 lb 6.4 oz (79.1 kg)   BMI 25.75 kg/m    Physical Exam Vitals and nursing note reviewed.  Constitutional:      Appearance: She is well-developed.  HENT:     Head: Normocephalic and atraumatic.  Eyes:     Conjunctiva/sclera: Conjunctivae normal.  Cardiovascular:     Rate and Rhythm: Normal rate and regular rhythm.     Heart sounds: Normal heart sounds.  Pulmonary:     Effort: Pulmonary effort is normal.     Breath sounds: Normal breath sounds.  Abdominal:     General: Bowel sounds are normal.     Palpations: Abdomen is soft.  Musculoskeletal:     Cervical back: Normal range of motion.  Lymphadenopathy:     Cervical: No cervical adenopathy.  Skin:    General: Skin is warm and dry.     Capillary Refill: Capillary refill takes less than 2 seconds.  Neurological:     Mental Status: She is alert and oriented to person, place, and time.  Psychiatric:        Behavior: Behavior normal.      Musculoskeletal Exam: C-spine was in good range of motion.  She had no discomfort range of motion of her shoulder joints.  Elbow joints, wrist joints, MCPs, PIPs and DIPs with good range of motion with no synovitis.  Hip joints, knee joints, ankles were in good range of motion.  She has no tenderness over MTPs.  CDAI Exam: CDAI Score: -- Patient Global: --; Provider Global: -- Swollen: --; Tender: -- Joint Exam 11/21/2019   No joint exam has been documented for this visit   There is currently no information documented on the homunculus. Go to the Rheumatology  activity and complete the homunculus joint exam.  Investigation: No additional findings.  Imaging: No results found.  Recent Labs: Lab Results  Component Value Date   WBC 7.4 04/17/2016   HGB 13.1 04/17/2016   PLT 217 04/17/2016   NA 138 04/17/2016   K 4.0 04/17/2016   CL 104 04/17/2016   CO2 24 04/17/2016   GLUCOSE 57 (L) 04/17/2016   BUN 11 04/17/2016   CREATININE 0.57 04/17/2016   BILITOT 0.5 04/17/2016   ALKPHOS 59 04/17/2016   AST 15 04/17/2016   ALT 6 04/17/2016   PROT 6.6 04/17/2016   ALBUMIN 4.1 04/17/2016   CALCIUM 9.3 04/17/2016   GFRAA >90 08/30/2011   Chart review from Dr. Kathrin Penner office.  In note from 01/22/2019 x-ray of the cervical spine showed mild degenerative changes and facet spondylosis of C4-5 and C5-C6.  Loss  of cervical lordosis consistent with muscle spasm.  X-ray of both shoulders were normal. Speciality Comments: No specialty comments available.  Procedures:  No procedures performed Allergies: Cat hair extract   Assessment / Plan:     Visit Diagnoses: Neck pain-she has chronic pain and stiffness in her cervical spine.  I reviewed records from Dr. Kathrin Penner office she had some degenerative changes and facet joint arthropathy.  She states symptoms have improved after physical therapy.  DDD (degenerative disc disease), lumbar-I do not have x-rays available.  Patient states she has degenerative changes in her lumbar spine and has done better after core strengthening in the last year.  She had no SI joint tenderness on examination today.  She has good mobility in her lumbar spine.  Chronic pain of both shoulders -she complains of pain and discomfort in her bilateral shoulders and sleeping on her side.  She experiences some noises in her left shoulder joint.  I reviewed records from Dr. Kathrin Penner office according to which her x-rays of both shoulders were unremarkable.  She may benefit from physical therapy.  To look for any underlying autoimmune process  I will obtain following labs.  Plan: Sedimentation rate, Rheumatoid factor, ANA, 14-3-3 eta Protein.  Have given her a handout on shoulder joint exercises.  A list of natural anti-inflammatories was given as well.  Other medical problems are listed as follows:  History of anxiety  History of asthma  History of PCOS  Orders: Orders Placed This Encounter  Procedures  . Sedimentation rate  . Rheumatoid factor  . ANA  . 14-3-3 eta Protein  . Ambulatory referral to Physical Therapy   No orders of the defined types were placed in this encounter.   Face-to-face time spent with patient was 45 minutes. Greater than 50% of time was spent in counseling and coordination of care.  Follow-Up Instructions: Return for Pain in both shoulders.   Bo Merino, MD  Note - This record has been created using Editor, commissioning.  Chart creation errors have been sought, but may not always  have been located. Such creation errors do not reflect on  the standard of medical care.

## 2019-11-21 ENCOUNTER — Ambulatory Visit: Payer: BC Managed Care – PPO | Admitting: Rheumatology

## 2019-11-21 ENCOUNTER — Encounter: Payer: Self-pay | Admitting: Rheumatology

## 2019-11-21 ENCOUNTER — Other Ambulatory Visit: Payer: Self-pay

## 2019-11-21 VITALS — BP 105/74 | HR 77 | Resp 14 | Ht 69.0 in | Wt 174.4 lb

## 2019-11-21 DIAGNOSIS — Z8709 Personal history of other diseases of the respiratory system: Secondary | ICD-10-CM

## 2019-11-21 DIAGNOSIS — M25512 Pain in left shoulder: Secondary | ICD-10-CM

## 2019-11-21 DIAGNOSIS — M5136 Other intervertebral disc degeneration, lumbar region: Secondary | ICD-10-CM

## 2019-11-21 DIAGNOSIS — M542 Cervicalgia: Secondary | ICD-10-CM | POA: Diagnosis not present

## 2019-11-21 DIAGNOSIS — Z8659 Personal history of other mental and behavioral disorders: Secondary | ICD-10-CM

## 2019-11-21 DIAGNOSIS — G8929 Other chronic pain: Secondary | ICD-10-CM

## 2019-11-21 DIAGNOSIS — M25511 Pain in right shoulder: Secondary | ICD-10-CM

## 2019-11-21 DIAGNOSIS — Z8742 Personal history of other diseases of the female genital tract: Secondary | ICD-10-CM

## 2019-11-21 DIAGNOSIS — M67911 Unspecified disorder of synovium and tendon, right shoulder: Secondary | ICD-10-CM

## 2019-11-21 DIAGNOSIS — M67912 Unspecified disorder of synovium and tendon, left shoulder: Secondary | ICD-10-CM

## 2019-11-21 NOTE — Patient Instructions (Signed)
Shoulder Exercises Ask your health care provider which exercises are safe for you. Do exercises exactly as told by your health care provider and adjust them as directed. It is normal to feel mild stretching, pulling, tightness, or discomfort as you do these exercises. Stop right away if you feel sudden pain or your pain gets worse. Do not begin these exercises until told by your health care provider. Stretching exercises External rotation and abduction This exercise is sometimes called corner stretch. This exercise rotates your arm outward (external rotation) and moves your arm out from your body (abduction). 1. Stand in a doorway with one of your feet slightly in front of the other. This is called a staggered stance. If you cannot reach your forearms to the door frame, stand facing a corner of a room. 2. Choose one of the following positions as told by your health care provider: ? Place your hands and forearms on the door frame above your head. ? Place your hands and forearms on the door frame at the height of your head. ? Place your hands on the door frame at the height of your elbows. 3. Slowly move your weight onto your front foot until you feel a stretch across your chest and in the front of your shoulders. Keep your head and chest upright and keep your abdominal muscles tight. 4. Hold for __________ seconds. 5. To release the stretch, shift your weight to your back foot. Repeat __________ times. Complete this exercise __________ times a day. Extension, standing 1. Stand and hold a broomstick, a cane, or a similar object behind your back. ? Your hands should be a little wider than shoulder width apart. ? Your palms should face away from your back. 2. Keeping your elbows straight and your shoulder muscles relaxed, move the stick away from your body until you feel a stretch in your shoulders (extension). ? Avoid shrugging your shoulders while you move the stick. Keep your shoulder blades tucked  down toward the middle of your back. 3. Hold for __________ seconds. 4. Slowly return to the starting position. Repeat __________ times. Complete this exercise __________ times a day. Range-of-motion exercises Pendulum  1. Stand near a wall or a surface that you can hold onto for balance. 2. Bend at the waist and let your left / right arm hang straight down. Use your other arm to support you. Keep your back straight and do not lock your knees. 3. Relax your left / right arm and shoulder muscles, and move your hips and your trunk so your left / right arm swings freely. Your arm should swing because of the motion of your body, not because you are using your arm or shoulder muscles. 4. Keep moving your hips and trunk so your arm swings in the following directions, as told by your health care provider: ? Side to side. ? Forward and backward. ? In clockwise and counterclockwise circles. 5. Continue each motion for __________ seconds, or for as long as told by your health care provider. 6. Slowly return to the starting position. Repeat __________ times. Complete this exercise __________ times a day. Shoulder flexion, standing  1. Stand and hold a broomstick, a cane, or a similar object. Place your hands a little more than shoulder width apart on the object. Your left / right hand should be palm up, and your other hand should be palm down. 2. Keep your elbow straight and your shoulder muscles relaxed. Push the stick up with your healthy arm to   raise your left / right arm in front of your body, and then over your head until you feel a stretch in your shoulder (flexion). ? Avoid shrugging your shoulder while you raise your arm. Keep your shoulder blade tucked down toward the middle of your back. 3. Hold for __________ seconds. 4. Slowly return to the starting position. Repeat __________ times. Complete this exercise __________ times a day. Shoulder abduction, standing 1. Stand and hold a broomstick,  a cane, or a similar object. Place your hands a little more than shoulder width apart on the object. Your left / right hand should be palm up, and your other hand should be palm down. 2. Keep your elbow straight and your shoulder muscles relaxed. Push the object across your body toward your left / right side. Raise your left / right arm to the side of your body (abduction) until you feel a stretch in your shoulder. ? Do not raise your arm above shoulder height unless your health care provider tells you to do that. ? If directed, raise your arm over your head. ? Avoid shrugging your shoulder while you raise your arm. Keep your shoulder blade tucked down toward the middle of your back. 3. Hold for __________ seconds. 4. Slowly return to the starting position. Repeat __________ times. Complete this exercise __________ times a day. Internal rotation  1. Place your left / right hand behind your back, palm up. 2. Use your other hand to dangle an exercise band, a towel, or a similar object over your shoulder. Grasp the band with your left / right hand so you are holding on to both ends. 3. Gently pull up on the band until you feel a stretch in the front of your left / right shoulder. The movement of your arm toward the center of your body is called internal rotation. ? Avoid shrugging your shoulder while you raise your arm. Keep your shoulder blade tucked down toward the middle of your back. 4. Hold for __________ seconds. 5. Release the stretch by letting go of the band and lowering your hands. Repeat __________ times. Complete this exercise __________ times a day. Strengthening exercises External rotation  1. Sit in a stable chair without armrests. 2. Secure an exercise band to a stable object at elbow height on your left / right side. 3. Place a soft object, such as a folded towel or a small pillow, between your left / right upper arm and your body to move your elbow about 4 inches (10 cm) away  from your side. 4. Hold the end of the exercise band so it is tight and there is no slack. 5. Keeping your elbow pressed against the soft object, slowly move your forearm out, away from your abdomen (external rotation). Keep your body steady so only your forearm moves. 6. Hold for __________ seconds. 7. Slowly return to the starting position. Repeat __________ times. Complete this exercise __________ times a day. Shoulder abduction  1. Sit in a stable chair without armrests, or stand up. 2. Hold a __________ weight in your left / right hand, or hold an exercise band with both hands. 3. Start with your arms straight down and your left / right palm facing in, toward your body. 4. Slowly lift your left / right hand out to your side (abduction). Do not lift your hand above shoulder height unless your health care provider tells you that this is safe. ? Keep your arms straight. ? Avoid shrugging your shoulder while you   do this movement. Keep your shoulder blade tucked down toward the middle of your back. 5. Hold for __________ seconds. 6. Slowly lower your arm, and return to the starting position. Repeat __________ times. Complete this exercise __________ times a day. Shoulder extension 1. Sit in a stable chair without armrests, or stand up. 2. Secure an exercise band to a stable object in front of you so it is at shoulder height. 3. Hold one end of the exercise band in each hand. Your palms should face each other. 4. Straighten your elbows and lift your hands up to shoulder height. 5. Step back, away from the secured end of the exercise band, until the band is tight and there is no slack. 6. Squeeze your shoulder blades together as you pull your hands down to the sides of your thighs (extension). Stop when your hands are straight down by your sides. Do not let your hands go behind your body. 7. Hold for __________ seconds. 8. Slowly return to the starting position. Repeat __________ times.  Complete this exercise __________ times a day. Shoulder row 1. Sit in a stable chair without armrests, or stand up. 2. Secure an exercise band to a stable object in front of you so it is at waist height. 3. Hold one end of the exercise band in each hand. Position your palms so that your thumbs are facing the ceiling (neutral position). 4. Bend each of your elbows to a 90-degree angle (right angle) and keep your upper arms at your sides. 5. Step back until the band is tight and there is no slack. 6. Slowly pull your elbows back behind you. 7. Hold for __________ seconds. 8. Slowly return to the starting position. Repeat __________ times. Complete this exercise __________ times a day. Shoulder press-ups  1. Sit in a stable chair that has armrests. Sit upright, with your feet flat on the floor. 2. Put your hands on the armrests so your elbows are bent and your fingers are pointing forward. Your hands should be about even with the sides of your body. 3. Push down on the armrests and use your arms to lift yourself off the chair. Straighten your elbows and lift yourself up as much as you comfortably can. ? Move your shoulder blades down, and avoid letting your shoulders move up toward your ears. ? Keep your feet on the ground. As you get stronger, your feet should support less of your body weight as you lift yourself up. 4. Hold for __________ seconds. 5. Slowly lower yourself back into the chair. Repeat __________ times. Complete this exercise __________ times a day. Wall push-ups  1. Stand so you are facing a stable wall. Your feet should be about one arm-length away from the wall. 2. Lean forward and place your palms on the wall at shoulder height. 3. Keep your feet flat on the floor as you bend your elbows and lean forward toward the wall. 4. Hold for __________ seconds. 5. Straighten your elbows to push yourself back to the starting position. Repeat __________ times. Complete this exercise  __________ times a day. This information is not intended to replace advice given to you by your health care provider. Make sure you discuss any questions you have with your health care provider. Document Revised: 10/25/2018 Document Reviewed: 08/02/2018 Elsevier Patient Education  2020 [employer]  

## 2019-11-27 LAB — RHEUMATOID FACTOR: Rheumatoid fact SerPl-aCnc: <14 IU/mL (ref <14)

## 2019-11-27 LAB — SEDIMENTATION RATE: Sed Rate: 11 mm/h (ref 0–20)

## 2019-11-27 LAB — ANA: Anti Nuclear Antibody (ANA): NEGATIVE

## 2019-11-27 LAB — 14-3-3 ETA PROTEIN: 14-3-3 eta Protein: <0.2 ng/mL (ref <0.2)

## 2019-11-28 NOTE — Progress Notes (Signed)
All labs are within normal limits.  I will discuss results at the follow-up visit.

## 2019-12-05 NOTE — Progress Notes (Deleted)
Office Visit Note  Patient: Michelle Beltran             Date of Birth: 1976/04/29           MRN: [id number]             PCP: Leeroy Cha, MD Referring: No ref. provider found Visit Date: 12/16/2019 Occupation: '@GUAROCC'$ @  Subjective:  No chief complaint on file.   History of Present Illness: Michelle Beltran is a 44 y.o. female ***   Activities of Daily Living:  Patient reports morning stiffness for *** {minute/hour:19697}.   Patient {ACTIONS;DENIES/REPORTS:21021675::"Denies"} nocturnal pain.  Difficulty dressing/grooming: {ACTIONS;DENIES/REPORTS:21021675::"Denies"} Difficulty climbing stairs: {ACTIONS;DENIES/REPORTS:21021675::"Denies"} Difficulty getting out of chair: {ACTIONS;DENIES/REPORTS:21021675::"Denies"} Difficulty using hands for taps, buttons, cutlery, and/or writing: {ACTIONS;DENIES/REPORTS:21021675::"Denies"}  No Rheumatology ROS completed.   PMFS History:  There are no problems to display for this patient.   Past Medical History:  Diagnosis Date   Allergy    dogs and cats   Anxiety    Asthma    Degenerative disc disease, lumbar    History of PCOS    STD (sexually transmitted disease) 1995   tx'd for gonorrhea   Urinary incontinence    Vitamin D deficiency     Family History  Problem Relation Age of Onset   Osteoporosis Mother    Hypertension Mother    Hyperlipidemia Mother    Hypertension Father    Hyperlipidemia Father    Arthritis Father    Heart disease Paternal Grandmother    Heart disease Paternal Grandfather    Past Surgical History:  Procedure Laterality Date   BREAST SURGERY     Reduction   finger nerve reconstruction Left 2013   left hand, 4th digit.   INDUCED ABORTION  2005   Social History   Social History Narrative   ** Merged History Encounter **       Immunization History  Administered Date(s) Administered   [employer] SARS-COV-2 Vaccination 10/11/2019, 11/04/2019    Tdap 08/30/2011     Objective: Vital Signs: There were no vitals taken for this visit.   Physical Exam   Musculoskeletal Exam: ***  CDAI Exam: CDAI Score: -- Patient Global: --; Provider Global: -- Swollen: --; Tender: -- Joint Exam 12/16/2019   No joint exam has been documented for this visit   There is currently no information documented on the homunculus. Go to the Rheumatology activity and complete the homunculus joint exam.  Investigation: No additional findings.  Imaging: No results found.  Recent Labs: Lab Results  Component Value Date   WBC 7.4 04/17/2016   HGB 13.1 04/17/2016   PLT 217 04/17/2016   NA 138 04/17/2016   K 4.0 04/17/2016   CL 104 04/17/2016   CO2 24 04/17/2016   GLUCOSE 57 (L) 04/17/2016   BUN 11 04/17/2016   CREATININE 0.57 04/17/2016   BILITOT 0.5 04/17/2016   ALKPHOS 59 04/17/2016   AST 15 04/17/2016   ALT 6 04/17/2016   PROT 6.6 04/17/2016   ALBUMIN 4.1 04/17/2016   CALCIUM 9.3 04/17/2016   GFRAA >90 08/30/2011   Nov 21 2018 ESR 11, ANA negative, RF negative, '14 3 3 '$ eta negative   Speciality Comments: No specialty comments available.  Procedures:  No procedures performed Allergies: Cat hair extract   Assessment / Plan:     Visit Diagnoses: No diagnosis found.  Orders: No orders of the defined types were placed in this encounter.  No orders of the defined types were placed  in this encounter.   Face-to-face time spent with patient was *** minutes. Greater than 50% of time was spent in counseling and coordination of care.  Follow-Up Instructions: No follow-ups on file.   Bo Merino, MD  Note - This record has been created using Editor, commissioning.  Chart creation errors have been sought, but may not always  have been located. Such creation errors do not reflect on  the standard of medical care.

## 2019-12-14 NOTE — Progress Notes (Signed)
Office Visit Note  Patient: Michelle Beltran             Date of Birth: Jan 10, 1976           MRN: [id number]             PCP: Leeroy Cha, MD Referring: Leeroy Cha,* Visit Date: 12/24/2019 Occupation: _0 @  Subjective:  Discuss lab work   History of Present Illness: Michelle Beltran is a 44 y.o. female with history of degenerative disc disease.  She states she continues to have neck and back discomfort.  She has been also having discomfort in her shoulder due to underlying tendinopathy.  She states she tried physical therapy but it did not help her much.  She denies any joint swelling.  Activities of Daily Living:  Patient reports morning stiffness for   10 minutes.   Patient Denies nocturnal pain.  Difficulty dressing/grooming: Denies Difficulty climbing stairs: Denies Difficulty getting out of chair: Denies Difficulty using hands for taps, buttons, cutlery, and/or writing: Denies  Review of Systems  Constitutional: Negative for fatigue.  HENT: Negative for mouth sores, mouth dryness and nose dryness.   Eyes: Negative for itching and dryness.  Respiratory: Negative for shortness of breath and difficulty breathing.   Cardiovascular: Negative for chest pain and palpitations.  Gastrointestinal: Negative for blood in stool, constipation and diarrhea.  Endocrine: Negative for increased urination.  Genitourinary: Negative for difficulty urinating and painful urination.  Musculoskeletal: Positive for arthralgias, joint pain, myalgias, morning stiffness, muscle tenderness and myalgias. Negative for joint swelling.  Skin: Negative for color change, rash and redness.  Allergic/Immunologic: Negative for susceptible to infections.  Neurological: Negative for dizziness, numbness, headaches, memory loss and weakness.  Hematological: Negative for bruising/bleeding tendency.  Psychiatric/Behavioral: Negative for confusion.    PMFS  History:  There are no problems to display for this patient.   Past Medical History:  Diagnosis Date   Allergy    dogs and cats   Anxiety    Asthma    Degenerative disc disease, lumbar    History of PCOS    STD (sexually transmitted disease) 1995   tx'd for gonorrhea   Urinary incontinence    Vitamin D deficiency     Family History  Problem Relation Age of Onset   Osteoporosis Mother    Hypertension Mother    Hyperlipidemia Mother    Hypertension Father    Hyperlipidemia Father    Arthritis Father    Heart disease Paternal Grandmother    Heart disease Paternal Grandfather    Past Surgical History:  Procedure Laterality Date   BREAST SURGERY     Reduction   finger nerve reconstruction Left 2013   left hand, 4th digit.   INDUCED ABORTION  2005   Social History   Social History Narrative   ** Merged History Encounter **       Immunization History  Administered Date(s) Administered   [employer] SARS-COV-2 Vaccination 10/11/2019, 11/04/2019   Tdap 08/30/2011     Objective: Vital Signs: BP 97/78 (BP Location: Left Arm, Patient Position: Sitting, Cuff Size: Normal)    Pulse 80    Resp 16    Ht 5' 7" (1.702 m)    Wt 175 lb (79.4 kg)    BMI 27.41 kg/m    Physical Exam Vitals and nursing note reviewed.  Constitutional:      Appearance: She is well-developed.  HENT:     Head: Normocephalic and atraumatic.  Eyes:  Conjunctiva/sclera: Conjunctivae normal.  Cardiovascular:     Rate and Rhythm: Normal rate and regular rhythm.     Heart sounds: Normal heart sounds.  Pulmonary:     Effort: Pulmonary effort is normal.     Breath sounds: Normal breath sounds.  Abdominal:     General: Bowel sounds are normal.     Palpations: Abdomen is soft.  Musculoskeletal:     Cervical back: Normal range of motion.  Lymphadenopathy:     Cervical: No cervical adenopathy.  Skin:    General: Skin is warm and dry.     Capillary Refill: Capillary refill takes  less than 2 seconds.  Neurological:     Mental Status: She is alert and oriented to person, place, and time.  Psychiatric:        Behavior: Behavior normal.      Musculoskeletal Exam: She has good range of motion of her cervical spine.  She has increased lumbar lordosis.  She had discomfort range of motion of her left shoulder joint anteriorly.  Elbow joints, wrist joints, MCPs, PIPs and DIPs with good range of motion.  Hip joints, knee joints, ankles, MTPs and PIPs in good range of motion with no synovitis.  CDAI Exam: CDAI Score: -- Patient Global: --; Provider Global: -- Swollen: --; Tender: -- Joint Exam 12/24/2019   No joint exam has been documented for this visit   There is currently no information documented on the homunculus. Go to the Rheumatology activity and complete the homunculus joint exam.  Investigation: No additional findings.  Imaging: No results found.  Recent Labs: Lab Results  Component Value Date   WBC 7.4 04/17/2016   HGB 13.1 04/17/2016   PLT 217 04/17/2016   NA 138 04/17/2016   K 4.0 04/17/2016   CL 104 04/17/2016   CO2 24 04/17/2016   GLUCOSE 57 (L) 04/17/2016   BUN 11 04/17/2016   CREATININE 0.57 04/17/2016   BILITOT 0.5 04/17/2016   ALKPHOS 59 04/17/2016   AST 15 04/17/2016   ALT 6 04/17/2016   PROT 6.6 04/17/2016   ALBUMIN 4.1 04/17/2016   CALCIUM 9.3 04/17/2016   GFRAA >90 08/30/2011  Nov 21, 2018 ESR 11, ANA negative, RF negative, 14-3-3n negative.  Speciality Comments: No specialty comments available.  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 Kathrin Penner office.  She continues to have some discomfort in her cervical spine.  Exercises were discussed.  DDD (degenerative disc disease), lumbar - According to patient she is doing better  after the core strengthening exercises.  Core strength exercises were demonstrated in the office today.  Bilateral shoulder tendinopathy-all autoimmune work-up is negative.  I detailed discussion regarding her lab work with her.  Muscle strengthening exercises were discussed.  History of anxiety  History of asthma  History of PCOS  Orders: No orders of the defined types were placed in this encounter.  No orders of the defined types were placed in this encounter.    Follow-Up Instructions: Return if symptoms worsen or fail to improve, for Osteoarthritis.   Bo Merino, MD  Note - This record has been created using Editor, commissioning.  Chart creation errors have been sought, but may not always  have been located. Such creation errors do not reflect on  the standard of medical care.

## 2019-12-16 ENCOUNTER — Ambulatory Visit: Payer: BLUE CROSS/BLUE SHIELD | Admitting: Rheumatology

## 2019-12-24 ENCOUNTER — Ambulatory Visit: Payer: BC Managed Care – PPO | Admitting: Rheumatology

## 2019-12-24 ENCOUNTER — Encounter: Payer: Self-pay | Admitting: Rheumatology

## 2019-12-24 ENCOUNTER — Other Ambulatory Visit: Payer: Self-pay

## 2019-12-24 VITALS — BP 97/78 | HR 80 | Resp 16 | Ht 67.0 in | Wt 175.0 lb

## 2019-12-24 DIAGNOSIS — Z8742 Personal history of other diseases of the female genital tract: Secondary | ICD-10-CM

## 2019-12-24 DIAGNOSIS — Z8709 Personal history of other diseases of the respiratory system: Secondary | ICD-10-CM

## 2019-12-24 DIAGNOSIS — M5030 Other cervical disc degeneration, unspecified cervical region: Secondary | ICD-10-CM

## 2019-12-24 DIAGNOSIS — Z8659 Personal history of other mental and behavioral disorders: Secondary | ICD-10-CM

## 2019-12-24 DIAGNOSIS — M5136 Other intervertebral disc degeneration, lumbar region: Secondary | ICD-10-CM

## 2019-12-24 DIAGNOSIS — M67911 Unspecified disorder of synovium and tendon, right shoulder: Secondary | ICD-10-CM

## 2019-12-24 DIAGNOSIS — M67912 Unspecified disorder of synovium and tendon, left shoulder: Secondary | ICD-10-CM

## 2020-04-29 LAB — HM PAP SMEAR

## 2020-05-07 ENCOUNTER — Ambulatory Visit: Payer: BC Managed Care – PPO | Attending: Internal Medicine

## 2020-05-07 DIAGNOSIS — Z23 Encounter for immunization: Secondary | ICD-10-CM

## 2020-05-07 NOTE — Progress Notes (Signed)
   Covid-19 Vaccination Clinic  Name:  Dalis Beers    MRN: [id number] DOB: 07-06-1976  05/07/2020  Ms. Damasceno- Willodean Rosenthal was observed post Covid-19 immunization for 15 minutes without incident. She was provided with Vaccine Information Sheet and instruction to access the V-Safe system.   Ms. Quincy Carnes- Willodean Rosenthal was instructed to call 911 with any severe reactions post vaccine: Marland Kitchen Difficulty breathing  . Swelling of face and throat  . A fast heartbeat  . A bad rash all over body  . Dizziness and weakness

## 2020-05-21 LAB — HM COLONOSCOPY

## 2020-06-29 ENCOUNTER — Telehealth: Payer: Self-pay

## 2020-06-29 NOTE — Telephone Encounter (Signed)
Patient is calling in regards to having pre-menopause. Patient is scheduled for AEX (07/12/20).

## 2020-06-29 NOTE — Telephone Encounter (Signed)
Call to patient. Patient states LMP was in 6/21. Prior to that cycles were 3/21 and 3/20. Patient states she is experiencing hot flashes and sweats. States she sent off a salvia test 10 days ago, but has not gotten results yet for hormonal testing. Patient requesting OV with Dr. Quincy Simmonds. Patient scheduled for 07-01-20 at 1500. Patient agreeable to date and time of appointment.   Routing to provider and will close encounter.

## 2020-06-30 ENCOUNTER — Encounter: Payer: Self-pay | Admitting: Family Medicine

## 2020-06-30 ENCOUNTER — Ambulatory Visit: Payer: BC Managed Care – PPO | Admitting: Family Medicine

## 2020-06-30 ENCOUNTER — Other Ambulatory Visit: Payer: Self-pay

## 2020-06-30 DIAGNOSIS — M2550 Pain in unspecified joint: Secondary | ICD-10-CM | POA: Diagnosis not present

## 2020-06-30 DIAGNOSIS — R202 Paresthesia of skin: Secondary | ICD-10-CM

## 2020-06-30 NOTE — Progress Notes (Signed)
GYNECOLOGY  VISIT   HPI: 44 y.o.   Married  [place]  female   [place] with Patient's last menstrual period was 01/07/2020 (exact date).   here for hot flashes and night sweats.  Prior menses was April, 2021 and then March, 2020.   She received an Rx for Combipatch earlier this year at another gynecology office.  She did not take this Rx due to risk of breast cancer.   She had [place] 63.7 and LH 29.2 January, 2021 at [facility].   She used some homeopathic remedies.  Works to help her hot flashes but she does not sleep well.   Has seen rheumatology.  Has had positive and then negative ANA result.   Joint pain.   GYNECOLOGIC HISTORY: Patient's last menstrual period was 01/07/2020 (exact date). Contraception: none --not SA Menopausal hormone therapy:  n/a Last mammogram: Had mammogram more recently with another GYN per patient.--normal.  10-9-17Hx.Br.reduction/Density B/Neg/BiRads1:TBC Last pap smear: 04-17-16 Neg:Neg HR HPV        OB History    Gravida  1   Para  0   Term  0   Preterm  0   AB  1   Living        SAB  0   IAB  0   Ectopic  0   Multiple      Live Births                 There are no problems to display for this patient.   Past Medical History:  Diagnosis Date  . Allergy    dogs and cats  . Anxiety   . Asthma   . Degenerative disc disease, lumbar   . History of PCOS   . STD (sexually transmitted disease) 1995   tx'd for gonorrhea  . Urinary incontinence   . Vitamin D deficiency     Past Surgical History:  Procedure Laterality Date  . BREAST SURGERY     Reduction  . finger nerve reconstruction Left 2013   left hand, 4th digit.  . INDUCED ABORTION  2005    Current Outpatient Medications  Medication Sig Dispense Refill  . albuterol (PROVENTIL HFA;VENTOLIN HFA) 108 (90 BASE) MCG/ACT inhaler Inhale 2 puffs into the lungs every 4 (four) hours as needed for wheezing. 1 Inhaler 0  . BEE POLLEN PO Take 3 capsules by  mouth daily.    Marland Kitchen BLACK COHOSH PO Take by mouth daily.    Marland Kitchen CALCIUM PO Take by mouth daily.    . Chaste Tree (VITEX EXTRACT PO) Take by mouth daily.    . Cholecalciferol (VITAMIN D3) 125 MCG (5000 UT) CAPS Take by mouth. Take 5000 units 5 days weekly.    . fexofenadine (ALLEGRA) 180 MG tablet Take 180 mg by mouth daily as needed.     Marland Kitchen OVER THE COUNTER MEDICATION 2 (two) times daily.    . Prasterone, DHEA, (DHEA ADVANCED FORMULA PO) Take by mouth daily.    . TURMERIC CURCUMIN PO Take by mouth daily.     No current facility-administered medications for this visit.     ALLERGIES: Cat hair extract  Family History  Problem Relation Age of Onset  . Osteoporosis Mother   . Hypertension Mother   . Hyperlipidemia Mother   . Hypertension Father   . Hyperlipidemia Father   . Arthritis Father   . Heart disease Paternal Grandmother   . Heart disease Paternal Grandfather     Social History  Socioeconomic History  . Marital status: Married    Spouse name: Not on file  . Number of children: Not on file  . Years of education: Not on file  . Highest education level: Not on file  Occupational History  . Not on file  Tobacco Use  . Smoking status: Former Smoker    Years: 20.00    Types: Cigarettes  . Smokeless tobacco: Never Used  Vaping Use  . Vaping Use: Every day  Substance and Sexual Activity  . Alcohol use: Yes    Comment: occ  . Drug use: Not Currently    Types: Marijuana    Comment: occ.  Marland Kitchen Sexual activity: Yes    Partners: Male    Birth control/protection: None  Other Topics Concern  . Not on file  Social History Narrative   ** Merged History Encounter **       Social Determinants of Health   Financial Resource Strain: Not on file  Food Insecurity: Not on file  Transportation Needs: Not on file  Physical Activity: Not on file  Stress: Not on file  Social Connections: Not on file  Intimate Partner Violence: Not on file    Review of Systems  All other  systems reviewed and are negative.   PHYSICAL EXAMINATION:    BP 110/70   Pulse 65   Ht 5' 8.5" (1.74 m)   Wt 161 lb (73 kg)   LMP 01/07/2020 (Exact Date)   SpO2 97%   BMI 24.12 kg/m     General appearance: alert, cooperative and appears stated age  ASSESSMENT  Perimenopausal female.  Vasomotor symptoms. Sleep disturbance.  PLAN We had a comprehensive discussion regarding perimenopause and menopause.  Options for symptom relief reviewed - herbal such as Estroven with melatonin, HRT, SSRI/SNRIs, gabapentin.  Risks and benefits of each reviewed.  Written information to patient.  Methods of delivery of HRT discussed.  Brochure on menopause to patient.   Changes in menopausal cardiovascular health and bone health reviewed.  Will need to get a copy of patient's mammogram prior to prescribing HRT if she chooses this option. FU for annual exam and prn.

## 2020-06-30 NOTE — Progress Notes (Signed)
Office Visit Note   Patient: Michelle Beltran- Willodean Rosenthal           Date of Birth: [birth year]           MRN: [id number] Visit Date: 06/30/2020 Requested by: Leeroy Cha, MD [address] [place],  [place] [address] PCP: Leeroy Cha, MD  Subjective: Chief Complaint  Patient presents with  . Other    Tingling in different areas of her body x 2-3 weeks. Does have pain in the upper back, with anterior left shoulder pain. Hurts in the lower back, as well.    HPI: She is here with tingling.  She states that for the past 2 or 3 weeks she has had intermittent tingling in various parts of her body.  It occurs briefly and intermittently, and not in the same place each time.  It has affected both of her arms, both of her legs, or face.  She cannot think of anything that could be causing this.  No change in her prescription or over-the-counter medication regimen.  Denies any headaches, visual disturbance, dietary change.  No family history of neurologic disorders to her knowledge.  She also continues to have left shoulder pain and would like clarification on home exercises for that.  Finally, in the past she has had prolozone injections at a previous town.  She would like referral to somebody who does that in this area.               ROS:   All other systems were reviewed and are negative.  Objective: Vital Signs: There were no vitals taken for this visit.  Physical Exam:  General:  Alert and oriented, in no acute distress. Pulm:  Breathing unlabored. Psy:  Normal mood, congruent affect. Skin: No rash No thyromegaly or nodules. Extremities: 5/5 upper and lower strength and 2+ DTRs.  No fasciculations.   Imaging: No results found.  Assessment & Plan: 1.  Tingling, etiology uncertain. -Labs to evaluate.  If labs are normal, she will wait a few weeks and if symptoms persist, consider brain MRI scan or neurologic consult.  2.  Chronic shoulder  pain -Home exercises given.  3.  Multiple joint pain -Referral to [facility].     Procedures: No procedures performed        PMFS History: There are no problems to display for this patient.  Past Medical History:  Diagnosis Date  . Allergy    dogs and cats  . Anxiety   . Asthma   . Degenerative disc disease, lumbar   . History of PCOS   . STD (sexually transmitted disease) 1995   tx'd for gonorrhea  . Urinary incontinence   . Vitamin D deficiency     Family History  Problem Relation Age of Onset  . Osteoporosis Mother   . Hypertension Mother   . Hyperlipidemia Mother   . Hypertension Father   . Hyperlipidemia Father   . Arthritis Father   . Heart disease Paternal Grandmother   . Heart disease Paternal Grandfather     Past Surgical History:  Procedure Laterality Date  . BREAST SURGERY     Reduction  . finger nerve reconstruction Left 2013   left hand, 4th digit.  . INDUCED ABORTION  2005   Social History   Occupational History  . Not on file  Tobacco Use  . Smoking status: Former Smoker    Years: 20.00    Types: Cigarettes  . Smokeless tobacco: Never  Used  Vaping Use  . Vaping Use: Every day  Substance and Sexual Activity  . Alcohol use: Yes    Comment: occ  . Drug use: Not Currently    Types: Marijuana    Comment: occ.  Marland Kitchen Sexual activity: Yes    Partners: Male    Birth control/protection: None

## 2020-07-01 ENCOUNTER — Ambulatory Visit: Payer: BC Managed Care – PPO | Admitting: Obstetrics and Gynecology

## 2020-07-01 ENCOUNTER — Encounter: Payer: Self-pay | Admitting: Obstetrics and Gynecology

## 2020-07-01 ENCOUNTER — Telehealth: Payer: Self-pay | Admitting: Family Medicine

## 2020-07-01 ENCOUNTER — Other Ambulatory Visit: Payer: Self-pay

## 2020-07-01 VITALS — BP 110/70 | HR 65 | Ht 68.5 in | Wt 161.0 lb

## 2020-07-01 DIAGNOSIS — N951 Menopausal and female climacteric states: Secondary | ICD-10-CM

## 2020-07-01 DIAGNOSIS — G479 Sleep disorder, unspecified: Secondary | ICD-10-CM | POA: Diagnosis not present

## 2020-07-01 LAB — CBC WITH DIFFERENTIAL/PLATELET
Absolute Monocytes: 379 cells/uL (ref 200–950)
Basophils Absolute: 47 cells/uL (ref 0–200)
Basophils Relative: 0.6 %
Eosinophils Absolute: 166 cells/uL (ref 15–500)
Eosinophils Relative: 2.1 %
HCT: 38.2 % (ref 35.0–45.0)
Hemoglobin: 13 g/dL (ref 11.7–15.5)
Lymphs Abs: 3215 cells/uL (ref 850–3900)
MCH: 32.9 pg (ref 27.0–33.0)
MCHC: 34 g/dL (ref 32.0–36.0)
MCV: 96.7 fL (ref 80.0–100.0)
MPV: 10.9 fL (ref 7.5–12.5)
Monocytes Relative: 4.8 %
Neutro Abs: 4092 cells/uL (ref 1500–7800)
Neutrophils Relative %: 51.8 %
Platelets: 236 10*3/uL (ref 140–400)
RBC: 3.95 10*6/uL (ref 3.80–5.10)
RDW: 12.3 % (ref 11.0–15.0)
Total Lymphocyte: 40.7 %
WBC: 7.9 10*3/uL (ref 3.8–10.8)

## 2020-07-01 LAB — THYROID PANEL WITH TSH
Free Thyroxine Index: 2.2 (ref 1.4–3.8)
T3 Uptake: 28 % (ref 22–35)
T4, Total: 8 ug/dL (ref 5.1–11.9)
TSH: 1.07 mIU/L

## 2020-07-01 LAB — COMPREHENSIVE METABOLIC PANEL
AG Ratio: 1.8 (calc) (ref 1.0–2.5)
ALT: 10 U/L (ref 6–29)
AST: 17 U/L (ref 10–35)
Albumin: 4.4 g/dL (ref 3.6–5.1)
Alkaline phosphatase (APISO): 56 U/L (ref 31–125)
BUN: 11 mg/dL (ref 7–25)
CO2: 29 mmol/L (ref 20–32)
Calcium: 9.9 mg/dL (ref 8.6–10.2)
Chloride: 102 mmol/L (ref 98–110)
Creat: 0.69 mg/dL (ref 0.50–1.10)
Globulin: 2.5 g/dL (calc) (ref 1.9–3.7)
Glucose, Bld: 86 mg/dL (ref 65–99)
Potassium: 4.3 mmol/L (ref 3.5–5.3)
Sodium: 141 mmol/L (ref 135–146)
Total Bilirubin: 0.9 mg/dL (ref 0.2–1.2)
Total Protein: 6.9 g/dL (ref 6.1–8.1)

## 2020-07-01 LAB — IRON,TIBC AND FERRITIN PANEL
%SAT: 41 % (calc) (ref 16–45)
Ferritin: 37 ng/mL (ref 16–232)
Iron: 138 ug/dL (ref 40–190)
TIBC: 334 mcg/dL (calc) (ref 250–450)

## 2020-07-01 LAB — VITAMIN B12: Vitamin B-12: 799 pg/mL (ref 200–1100)

## 2020-07-01 NOTE — Patient Instructions (Signed)
Gabapentin capsules or tablets What is this medicine? GABAPENTIN (GA ba pen tin) is used to control seizures in certain types of epilepsy. It is also used to treat certain types of nerve pain. This medicine may be used for other purposes; ask your health care provider or pharmacist if you have questions. COMMON BRAND NAME(S): Active-PAC with Gabapentin, Gabarone, Neurontin What should I tell my health care provider before I take this medicine? They need to know if you have any of these conditions:  history of drug abuse or alcohol abuse problem  kidney disease  lung or breathing disease  suicidal thoughts, plans, or attempt; a previous suicide attempt by you or a family member  an unusual or allergic reaction to gabapentin, other medicines, foods, dyes, or preservatives  pregnant or trying to get pregnant  breast-feeding How should I use this medicine? Take this medicine by mouth with a glass of water. Follow the directions on the prescription label. You can take it with or without food. If it upsets your stomach, take it with food. Take your medicine at regular intervals. Do not take it more often than directed. Do not stop taking except on your doctor's advice. If you are directed to break the 600 or 800 mg tablets in half as part of your dose, the extra half tablet should be used for the next dose. If you have not used the extra half tablet within 28 days, it should be thrown away. A special MedGuide will be given to you by the pharmacist with each prescription and refill. Be sure to read this information carefully each time. Talk to your pediatrician regarding the use of this medicine in children. While this drug may be prescribed for children as young as 3 years for selected conditions, precautions do apply. Overdosage: If you think you have taken too much of this medicine contact a poison control center or emergency room at once. NOTE: This medicine is only for you. Do not share this  medicine with others. What if I miss a dose? If you miss a dose, take it as soon as you can. If it is almost time for your next dose, take only that dose. Do not take double or extra doses. What may interact with this medicine? This medicine may interact with the following medications:  alcohol  antihistamines for allergy, cough, and cold  certain medicines for anxiety or sleep  certain medicines for depression like amitriptyline, fluoxetine, sertraline  certain medicines for seizures like phenobarbital, primidone  certain medicines for stomach problems  general anesthetics like halothane, isoflurane, methoxyflurane, propofol  local anesthetics like lidocaine, pramoxine, tetracaine  medicines that relax muscles for surgery  narcotic medicines for pain  phenothiazines like chlorpromazine, mesoridazine, prochlorperazine, thioridazine This list may not describe all possible interactions. Give your health care provider a list of all the medicines, herbs, non-prescription drugs, or dietary supplements you use. Also tell them if you smoke, drink alcohol, or use illegal drugs. Some items may interact with your medicine. What should I watch for while using this medicine? Visit your doctor or health care provider for regular checks on your progress. You may want to keep a record at home of how you feel your condition is responding to treatment. You may want to share this information with your doctor or health care provider at each visit. You should contact your doctor or health care provider if your seizures get worse or if you have any new types of seizures. Do not stop taking   this medicine or any of your seizure medicines unless instructed by your doctor or health care provider. Stopping your medicine suddenly can increase your seizures or their severity. This medicine may cause serious skin reactions. They can happen weeks to months after starting the medicine. Contact your health care  provider right away if you notice fevers or flu-like symptoms with a rash. The rash may be red or purple and then turn into blisters or peeling of the skin. Or, you might notice a red rash with swelling of the face, lips or lymph nodes in your neck or under your arms. Wear a medical identification bracelet or chain if you are taking this medicine for seizures, and carry a card that lists all your medications. You may get drowsy, dizzy, or have blurred vision. Do not drive, use machinery, or do anything that needs mental alertness until you know how this medicine affects you. To reduce dizzy or fainting spells, do not sit or stand up quickly, especially if you are an older patient. Alcohol can increase drowsiness and dizziness. Avoid alcoholic drinks. Your mouth may get dry. Chewing sugarless gum or sucking hard candy, and drinking plenty of water will help. The use of this medicine may increase the chance of suicidal thoughts or actions. Pay special attention to how you are responding while on this medicine. Any worsening of mood, or thoughts of suicide or dying should be reported to your health care provider right away. Women who become pregnant while using this medicine may enroll in the [place] Pregnancy Registry by calling [phone]. This registry collects information about the safety of antiepileptic drug use during pregnancy. What side effects may I notice from receiving this medicine? Side effects that you should report to your doctor or health care professional as soon as possible:  allergic reactions like skin rash, itching or hives, swelling of the face, lips, or tongue  breathing problems  rash, fever, and swollen lymph nodes  redness, blistering, peeling or loosening of the skin, including inside the mouth  suicidal thoughts, mood changes Side effects that usually do not require medical attention (report to your doctor or health care professional if they  continue or are bothersome):  dizziness  drowsiness  headache  nausea, vomiting  swelling of ankles, feet, hands  tiredness This list may not describe all possible side effects. Call your doctor for medical advice about side effects. You may report side effects to FDA at 1-800-FDA-1088. Where should I keep my medicine? Keep out of reach of children. This medicine may cause accidental overdose and death if it taken by other adults, children, or pets. Mix any unused medicine with a substance like cat litter or coffee grounds. Then throw the medicine away in a sealed container like a sealed bag or a coffee can with a lid. Do not use the medicine after the expiration date. Store at room temperature between 15 and 30 degrees C (59 and 86 degrees F). NOTE: This sheet is a summary. It may not cover all possible information. If you have questions about this medicine, talk to your doctor, pharmacist, or health care provider.  2020 Elsevier/Gold Standard (2018-10-04 14:16:43) Menopause and Hormone Replacement Therapy Menopause is a normal time of life when menstrual periods stop completely and the ovaries stop producing the female hormones estrogen and progesterone. This lack of hormones can affect your health and cause undesirable symptoms. Hormone replacement therapy (HRT) can relieve some of those symptoms. What is hormone replacement therapy? HRT  is the use of artificial (synthetic) hormones to replace hormones that your body has stopped producing because you have reached menopause. What are my options for HRT?  HRT may consist of the synthetic hormones estrogen and progestin, or it may consist of only estrogen (estrogen-only therapy). You and your health care provider will decide which form of HRT is best for you. If you choose to be on HRT and you have a uterus, estrogen and progestin are usually prescribed. Estrogen-only therapy is used for women who do not have a uterus. Possible options  for taking HRT include:  Pills.  Patches.  Gels.  Sprays.  Vaginal cream.  Vaginal rings.  Vaginal inserts. The amount of hormone(s) that you take and how long you take the hormone(s) varies according to your health. It is important to:  Begin HRT with the lowest possible dosage.  Stop HRT as soon as your health care provider tells you to stop.  Work with your health care provider so that you feel informed and comfortable with your decisions. What are the benefits of HRT? HRT can reduce the frequency and severity of menopausal symptoms. Benefits of HRT vary according to the kind of symptoms that you have, how severe they are, and your overall health. HRT may help to improve the following symptoms of menopause:  Hot flashes and night sweats. These are sudden feelings of heat that spread over the face and body. The skin may turn red, like a blush. Night sweats are hot flashes that happen while you are sleeping or trying to sleep.  Bone loss (osteoporosis). The body loses calcium more quickly after menopause, causing the bones to become weaker. This can increase the risk for bone breaks (fractures).  Vaginal dryness. The lining of the vagina can become thin and dry, which can cause pain during sex or cause infection, burning, or itching.  Urinary tract infections.  Urinary incontinence. This is the inability to control when you pass urine.  Irritability.  Short-term memory problems. What are the risks of HRT? Risks of HRT vary depending on your individual health and medical history. Risks of HRT also depend on whether you receive both estrogen and progestin or you receive estrogen only. HRT may increase the risk of:  Spotting. This is when a small amount of blood leaks from the vagina unexpectedly.  Endometrial cancer. This cancer is in the lining of the uterus (endometrium).  Breast cancer.  Increased density of breast tissue. This can make it harder to find breast cancer  on a breast X-ray (mammogram).  Stroke.  Heart disease.  Blood clots.  Gallbladder disease.  Liver disease. Risks of HRT can increase if you have any of the following conditions:  Endometrial cancer.  Liver disease.  Heart disease.  Breast cancer.  History of blood clots.  History of stroke. Follow these instructions at home:  Take over-the-counter and prescription medicines only as told by your health care provider.  Get mammograms, pelvic exams, and medical checkups as often as told by your health care provider.  Have Pap tests done as often as told by your health care provider. A Pap test is sometimes called a Pap smear. It is a screening test that is used to check for signs of cancer of the cervix and vagina. A Pap test can also identify the presence of infection or precancerous changes. Pap tests may be done: ? Every 3 years, starting at age 16. ? Every 5 years, starting after age 71, in combination with  testing for human papillomavirus (HPV). ? More often or less often depending on other medical conditions you have, your age, and other risk factors.  It is up to you to get the results of your Pap test. Ask your health care provider, or the department that is doing the test, when your results will be ready.  Keep all follow-up visits as told by your health care provider. This is important. Contact a health care provider if you have:  Pain or swelling in your legs.  Shortness of breath.  Chest pain.  Lumps or changes in your breasts or armpits.  Slurred speech.  Pain, burning, or bleeding when you urinate.  Unusual vaginal bleeding.  Dizziness or headaches.  Weakness or numbness in any part of your arms or legs.  Pain in your abdomen. Summary  Menopause is a normal time of life when menstrual periods stop completely and the ovaries stop producing the female hormones estrogen and progesterone.  Hormone replacement therapy (HRT) can relieve some of the  symptoms of menopause.  HRT can reduce the frequency and severity of menopausal symptoms.  Risks of HRT vary depending on your individual health and medical history. This information is not intended to replace advice given to you by your health care provider. Make sure you discuss any questions you have with your health care provider. Document Revised: 03/05/2018 Document Reviewed: 03/05/2018 Elsevier Patient Education  [place]. Paroxetine tablets What is this medicine? PAROXETINE (pa ROX e teen) is used to treat depression. It may also be used to treat anxiety disorders, obsessive compulsive disorder, panic attacks, post traumatic stress, and premenstrual dysphoric disorder (PMDD). This medicine may be used for other purposes; ask your health care provider or pharmacist if you have questions. COMMON BRAND NAME(S): Paxil, Pexeva What should I tell my health care provider before I take this medicine? They need to know if you have any of these conditions:  bipolar disorder or a family history of bipolar disorder  bleeding disorders  glaucoma  heart disease  kidney disease  liver disease  low levels of sodium in the blood  seizures  suicidal thoughts, plans, or attempt; a previous suicide attempt by you or a family member  take MAOIs like Carbex, Eldepryl, Marplan, Nardil, and Parnate  take medicines that treat or prevent blood clots  thyroid disease  an unusual or allergic reaction to paroxetine, other medicines, foods, dyes, or preservatives  pregnant or trying to get pregnant  breast-feeding How should I use this medicine? Take this medicine by mouth with a glass of water. Follow the directions on the prescription label. You can take it with or without food. Take your medicine at regular intervals. Do not take your medicine more often than directed. Do not stop taking this medicine suddenly except upon the advice of your doctor. Stopping this medicine too  quickly may cause serious side effects or your condition may worsen. A special MedGuide will be given to you by the pharmacist with each prescription and refill. Be sure to read this information carefully each time. Talk to your pediatrician regarding the use of this medicine in children. Special care may be needed. Overdosage: If you think you have taken too much of this medicine contact a poison control center or emergency room at once. NOTE: This medicine is only for you. Do not share this medicine with others. What if I miss a dose? If you miss a dose, take it as soon as you can. If it is  almost time for your next dose, take only that dose. Do not take double or extra doses. What may interact with this medicine? Do not take this medicine with any of the following medications:  linezolid  MAOIs like Carbex, Eldepryl, Marplan, Nardil, and Parnate  methylene blue (injected into a vein)  pimozide  thioridazine This medicine may also interact with the following medications:  alcohol  amphetamines  aspirin and aspirin-like medicines  atomoxetine  certain medicines for depression, anxiety, or psychotic disturbances  certain medicines for irregular heart beat like propafenone, flecainide, encainide, and quinidine  certain medicines for migraine headache like almotriptan, eletriptan, frovatriptan, naratriptan, rizatriptan, sumatriptan, zolmitriptan  cimetidine  digoxin  diuretics  fentanyl  fosamprenavir  furazolidone  isoniazid  lithium  medicines that treat or prevent blood clots like warfarin, enoxaparin, and dalteparin  medicines for sleep  NSAIDs, medicines for pain and inflammation, like ibuprofen or naproxen  phenobarbital  phenytoin  procarbazine  rasagiline  ritonavir  supplements like St. John's wort, kava kava, valerian  tamoxifen  tramadol  tryptophan This list may not describe all possible interactions. Give your health care provider a  list of all the medicines, herbs, non-prescription drugs, or dietary supplements you use. Also tell them if you smoke, drink alcohol, or use illegal drugs. Some items may interact with your medicine. What should I watch for while using this medicine? Tell your doctor if your symptoms do not get better or if they get worse. Visit your doctor or health care professional for regular checks on your progress. Because it may take several weeks to see the full effects of this medicine, it is important to continue your treatment as prescribed by your doctor. Patients and their families should watch out for new or worsening thoughts of suicide or depression. Also watch out for sudden changes in feelings such as feeling anxious, agitated, panicky, irritable, hostile, aggressive, impulsive, severely restless, overly excited and hyperactive, or not being able to sleep. If this happens, especially at the beginning of treatment or after a change in dose, call your health care professional. Dennis Bast may get drowsy or dizzy. Do not drive, use machinery, or do anything that needs mental alertness until you know how this medicine affects you. Do not stand or sit up quickly, especially if you are an older patient. This reduces the risk of dizzy or fainting spells. Alcohol may interfere with the effect of this medicine. Avoid alcoholic drinks. Your mouth may get dry. Chewing sugarless gum or sucking hard candy, and drinking plenty of water will help. Contact your doctor if the problem does not go away or is severe. What side effects may I notice from receiving this medicine? Side effects that you should report to your doctor or health care professional as soon as possible:  allergic reactions like skin rash, itching or hives, swelling of the face, lips, or tongue  anxious  black, tarry stools  changes in vision  confusion  elevated mood, decreased need for sleep, racing thoughts, impulsive behavior  eye pain  fast,  irregular heartbeat  feeling faint or lightheaded, falls  feeling agitated, angry, or irritable  hallucination, loss of contact with reality  loss of balance or coordination  loss of memory  painful or prolonged erections  restlessness, pacing, inability to keep still  seizures  stiff muscles  suicidal thoughts or other mood changes  trouble sleeping  unusual bleeding or bruising  unusually weak or tired  vomiting Side effects that usually do not require medical  attention (report to your doctor or health care professional if they continue or are bothersome):  change in appetite or weight  change in sex drive or performance  diarrhea  dizziness  dry mouth  increased sweating  indigestion, nausea  tired  tremors This list may not describe all possible side effects. Call your doctor for medical advice about side effects. You may report side effects to FDA at 1-800-FDA-1088. Where should I keep my medicine? Keep out of the reach of children. Store at room temperature between 15 and 30 degrees C (59 and 86 degrees F). Keep container tightly closed. Throw away any unused medicine after the expiration date. NOTE: This sheet is a summary. It may not cover all possible information. If you have questions about this medicine, talk to your doctor, pharmacist, or health care provider.  2020 Elsevier/Gold Standard (2015-12-04 15:50:32) Venlafaxine extended-release capsules What is this medicine? VENLAFAXINE(VEN la fax een) is used to treat depression, anxiety and panic disorder. This medicine may be used for other purposes; ask your health care provider or pharmacist if you have questions. COMMON BRAND NAME(S): Effexor XR What should I tell my health care provider before I take this medicine? They need to know if you have any of these conditions:  bleeding disorders  glaucoma  heart disease  high blood pressure  high cholesterol  kidney disease  liver  disease  low levels of sodium in the blood  mania or bipolar disorder  seizures  suicidal thoughts, plans, or attempt; a previous suicide attempt by you or a family  take medicines that treat or prevent blood clots  thyroid disease  an unusual or allergic reaction to venlafaxine, desvenlafaxine, other medicines, foods, dyes, or preservatives  pregnant or trying to get pregnant  breast-feeding How should I use this medicine? Take this medicine by mouth with a full glass of water. Follow the directions on the prescription label. Do not cut, crush, or chew this medicine. Take it with food. If needed, the capsule may be carefully opened and the entire contents sprinkled on a spoonful of cool applesauce. Swallow the applesauce/pellet mixture right away without chewing and follow with a glass of water to ensure complete swallowing of the pellets. Try to take your medicine at about the same time each day. Do not take your medicine more often than directed. Do not stop taking this medicine suddenly except upon the advice of your doctor. Stopping this medicine too quickly may cause serious side effects or your condition may worsen. A special MedGuide will be given to you by the pharmacist with each prescription and refill. Be sure to read this information carefully each time. Talk to your pediatrician regarding the use of this medicine in children. Special care may be needed. Overdosage: If you think you have taken too much of this medicine contact a poison control center or emergency room at once. NOTE: This medicine is only for you. Do not share this medicine with others. What if I miss a dose? If you miss a dose, take it as soon as you can. If it is almost time for your next dose, take only that dose. Do not take double or extra doses. What may interact with this medicine? Do not take this medicine with any of the following medications:  certain medicines for fungal infections like  fluconazole, itraconazole, ketoconazole, posaconazole, voriconazole  cisapride  desvenlafaxine  dronedarone  duloxetine  levomilnacipran  linezolid  MAOIs like Carbex, Eldepryl, Marplan, Nardil, and Parnate  methylene  blue (injected into a vein)  milnacipran  pimozide  thioridazine This medicine may also interact with the following medications:  amphetamines  aspirin and aspirin-like medicines  certain medicines for depression, anxiety, or psychotic disturbances  certain medicines for migraine headaches like almotriptan, eletriptan, frovatriptan, naratriptan, rizatriptan, sumatriptan, zolmitriptan  certain medicines for sleep  certain medicines that treat or prevent blood clots like dalteparin, enoxaparin, warfarin  cimetidine  clozapine  diuretics  fentanyl  furazolidone  indinavir  isoniazid  lithium  metoprolol  NSAIDS, medicines for pain and inflammation, like ibuprofen or naproxen  other medicines that prolong the QT interval (cause an abnormal heart rhythm) like dofetilide, ziprasidone  procarbazine  rasagiline  supplements like St. John's wort, kava kava, valerian  tramadol  tryptophan This list may not describe all possible interactions. Give your health care provider a list of all the medicines, herbs, non-prescription drugs, or dietary supplements you use. Also tell them if you smoke, drink alcohol, or use illegal drugs. Some items may interact with your medicine. What should I watch for while using this medicine? Tell your doctor if your symptoms do not get better or if they get worse. Visit your doctor or health care professional for regular checks on your progress. Because it may take several weeks to see the full effects of this medicine, it is important to continue your treatment as prescribed by your doctor. Patients and their families should watch out for new or worsening thoughts of suicide or depression. Also watch out for  sudden changes in feelings such as feeling anxious, agitated, panicky, irritable, hostile, aggressive, impulsive, severely restless, overly excited and hyperactive, or not being able to sleep. If this happens, especially at the beginning of treatment or after a change in dose, call your health care professional. This medicine can cause an increase in blood pressure. Check with your doctor for instructions on monitoring your blood pressure while taking this medicine. You may get drowsy or dizzy. Do not drive, use machinery, or do anything that needs mental alertness until you know how this medicine affects you. Do not stand or sit up quickly, especially if you are an older patient. This reduces the risk of dizzy or fainting spells. Alcohol may interfere with the effect of this medicine. Avoid alcoholic drinks. Your mouth may get dry. Chewing sugarless gum, sucking hard candy and drinking plenty of water will help. Contact your doctor if the problem does not go away or is severe. What side effects may I notice from receiving this medicine? Side effects that you should report to your doctor or health care professional as soon as possible:  allergic reactions like skin rash, itching or hives, swelling of the face, lips, or tongue  anxious  breathing problems  confusion  changes in vision  chest pain  confusion  elevated mood, decreased need for sleep, racing thoughts, impulsive behavior  eye pain  fast, irregular heartbeat  feeling faint or lightheaded, falls  feeling agitated, angry, or irritable  hallucination, loss of contact with reality  high blood pressure  loss of balance or coordination  palpitations  redness, blistering, peeling or loosening of the skin, including inside the mouth  restlessness, pacing, inability to keep still  seizures  stiff muscles  suicidal thoughts or other mood changes  trouble passing urine or change in the amount of urine  trouble  sleeping  unusual bleeding or bruising  unusually weak or tired  vomiting Side effects that usually do not require medical attention (report to  your doctor or health care professional if they continue or are bothersome):  change in sex drive or performance  change in appetite or weight  constipation  dizziness  dry mouth  headache  increased sweating  nausea  tired This list may not describe all possible side effects. Call your doctor for medical advice about side effects. You may report side effects to FDA at 1-800-FDA-1088. Where should I keep my medicine? Keep out of the reach of children. Store at a controlled temperature between 20 and 25 degrees C (68 degrees and 77 degrees F), in a dry place. Throw away any unused medicine after the expiration date. NOTE: This sheet is a summary. It may not cover all possible information. If you have questions about this medicine, talk to your doctor, pharmacist, or health care provider.  2020 Elsevier/Gold Standard (2018-06-25 12:06:43)

## 2020-07-01 NOTE — Telephone Encounter (Signed)
Labs look good, nothing to explain the tingling.

## 2020-07-07 NOTE — Progress Notes (Deleted)
44 y.o. G55P0010 Married [place] female here for annual exam.    PCP:     No LMP recorded. (Menstrual status: Irregular Periods).           Sexually active: {yes no:314532}  The current method of family planning is*** abstinence.    Exercising: {yes RD:408144}  {types:19826} Smoker:  Former  Health Maintenance: Pap: 04-17-16 Neg:Neg HR HPV History of abnormal Pap:  {YES NO:22349} MMG:  Had mammogram more recently with another GYN per patient.--normal. 10-9-17Hx.Br.reduction/Density B/Neg/BiRads1:TBC Colonoscopy:  *** BMD:   ***  Result  *** TDaP:  08-30-11 Gardasil:   {YES NO:22349} HIV:*** Hep C:*** Screening Labs:  Hb today: ***, Urine today: ***   reports that she has quit smoking. Her smoking use included cigarettes. She quit after 20.00 years of use. She has never used smokeless tobacco. She reports current alcohol use. She reports previous drug use. Drug: Marijuana.  Past Medical History:  Diagnosis Date  . Allergy    dogs and cats  . Anxiety   . Asthma   . Degenerative disc disease, lumbar   . History of PCOS   . STD (sexually transmitted disease) 1995   tx'd for gonorrhea  . Urinary incontinence   . Vitamin D deficiency     Past Surgical History:  Procedure Laterality Date  . BREAST SURGERY     Reduction  . finger nerve reconstruction Left 2013   left hand, 4th digit.  . INDUCED ABORTION  2005    Current Outpatient Medications  Medication Sig Dispense Refill  . albuterol (PROVENTIL HFA;VENTOLIN HFA) 108 (90 BASE) MCG/ACT inhaler Inhale 2 puffs into the lungs every 4 (four) hours as needed for wheezing. 1 Inhaler 0  . BEE POLLEN PO Take 3 capsules by mouth daily.    Marland Kitchen BLACK COHOSH PO Take by mouth daily.    Marland Kitchen CALCIUM PO Take by mouth daily.    . Chaste Tree (VITEX EXTRACT PO) Take by mouth daily.    . Cholecalciferol (VITAMIN D3) 125 MCG (5000 UT) CAPS Take by mouth. Take 5000 units 5 days weekly.    . fexofenadine (ALLEGRA) 180 MG tablet Take 180 mg  by mouth daily as needed.     Marland Kitchen OVER THE COUNTER MEDICATION 2 (two) times daily.    . Prasterone, DHEA, (DHEA ADVANCED FORMULA PO) Take by mouth daily.    . TURMERIC CURCUMIN PO Take by mouth daily.     No current facility-administered medications for this visit.    Family History  Problem Relation Age of Onset  . Osteoporosis Mother   . Hypertension Mother   . Hyperlipidemia Mother   . Hypertension Father   . Hyperlipidemia Father   . Arthritis Father   . Heart disease Paternal Grandmother   . Heart disease Paternal Grandfather     Review of Systems  Exam:   There were no vitals taken for this visit.    General appearance: alert, cooperative and appears stated age Head: normocephalic, without obvious abnormality, atraumatic Neck: no adenopathy, supple, symmetrical, trachea midline and thyroid normal to inspection and palpation Lungs: clear to auscultation bilaterally Breasts: normal appearance, no masses or tenderness, No nipple retraction or dimpling, No nipple discharge or bleeding, No axillary adenopathy Heart: regular rate and rhythm Abdomen: soft, non-tender; no masses, no organomegaly Extremities: extremities normal, atraumatic, no cyanosis or edema Skin: skin color, texture, turgor normal. No rashes or lesions Lymph nodes: cervical, supraclavicular, and axillary nodes normal. Neurologic: grossly normal  Pelvic: External genitalia:  no lesions              No abnormal inguinal nodes palpated.              Urethra:  normal appearing urethra with no masses, tenderness or lesions              Bartholins and Skenes: normal                 Vagina: normal appearing vagina with normal color and discharge, no lesions              Cervix: no lesions              Pap taken: {yes no:314532} Bimanual Exam:  Uterus:  normal size, contour, position, consistency, mobility, non-tender              Adnexa: no mass, fullness, tenderness              Rectal exam: {yes no:314532}.   Confirms.              Anus:  normal sphincter tone, no lesions  Chaperone was present for exam.  Assessment:   Well woman visit with normal exam.   Plan: Mammogram screening discussed. Self breast awareness reviewed. Pap and HR HPV as above. Guidelines for Calcium, Vitamin D, regular exercise program including cardiovascular and weight bearing exercise.   Follow up annually and prn.   Additional counseling given.  {yes [id number]. _______ minutes face to face time of which over 50% was spent in counseling.    After visit summary provided.

## 2020-07-12 ENCOUNTER — Ambulatory Visit: Payer: BC Managed Care – PPO | Admitting: Obstetrics and Gynecology

## 2020-07-12 ENCOUNTER — Encounter: Payer: Self-pay | Admitting: Obstetrics and Gynecology

## 2020-07-20 ENCOUNTER — Encounter: Payer: Self-pay | Admitting: Internal Medicine

## 2020-07-20 ENCOUNTER — Other Ambulatory Visit: Payer: Self-pay

## 2020-07-20 ENCOUNTER — Ambulatory Visit (INDEPENDENT_AMBULATORY_CARE_PROVIDER_SITE_OTHER): Payer: BC Managed Care – PPO | Admitting: Internal Medicine

## 2020-07-20 VITALS — BP 104/62 | HR 74 | Temp 98.9°F | Ht 68.5 in | Wt 163.0 lb

## 2020-07-20 DIAGNOSIS — R768 Other specified abnormal immunological findings in serum: Secondary | ICD-10-CM | POA: Insufficient documentation

## 2020-07-20 DIAGNOSIS — Z1231 Encounter for screening mammogram for malignant neoplasm of breast: Secondary | ICD-10-CM

## 2020-07-20 DIAGNOSIS — R5383 Other fatigue: Secondary | ICD-10-CM | POA: Insufficient documentation

## 2020-07-20 DIAGNOSIS — Z0000 Encounter for general adult medical examination without abnormal findings: Secondary | ICD-10-CM | POA: Diagnosis not present

## 2020-07-20 DIAGNOSIS — Z0001 Encounter for general adult medical examination with abnormal findings: Secondary | ICD-10-CM | POA: Insufficient documentation

## 2020-07-20 LAB — LIPID PANEL
Cholesterol: 230 mg/dL — ABNORMAL HIGH (ref 0–200)
HDL: 81.5 mg/dL (ref >39.00)
LDL Cholesterol: 130 mg/dL — ABNORMAL HIGH (ref 0–99)
NonHDL: 148.7
Total CHOL/HDL Ratio: 3
Triglycerides: 96 mg/dL (ref 0.0–149.0)
VLDL: 19.2 mg/dL (ref 0.0–40.0)

## 2020-07-20 NOTE — Patient Instructions (Addendum)
Health Maintenance, Female Adopting a healthy lifestyle and getting preventive care are important in promoting health and wellness. Ask your health care provider about:  The right schedule for you to have regular tests and exams.  Things you can do on your own to prevent diseases and keep yourself healthy. What should I know about diet, weight, and exercise? Eat a healthy diet   Eat a diet that includes plenty of vegetables, fruits, low-fat dairy products, and lean protein.  Do not eat a lot of foods that are high in solid fats, added sugars, or sodium. Maintain a healthy weight Body mass index (BMI) is used to identify weight problems. It estimates body fat based on height and weight. Your health care provider can help determine your BMI and help you achieve or maintain a healthy weight. Get regular exercise Get regular exercise. This is one of the most important things you can do for your health. Most adults should:  Exercise for at least 150 minutes each week. The exercise should increase your heart rate and make you sweat (moderate-intensity exercise).  Do strengthening exercises at least twice a week. This is in addition to the moderate-intensity exercise.  Spend less time sitting. Even light physical activity can be beneficial. Watch cholesterol and blood lipids Have your blood tested for lipids and cholesterol at 47 years of age, then have this test every 5 years. Have your cholesterol levels checked more often if:  Your lipid or cholesterol levels are high.  You are older than 47 years of age.  You are at high risk for heart disease. What should I know about cancer screening? Depending on your health history and family history, you may need to have cancer screening at various ages. This may include screening for:  Breast cancer.  Cervical cancer.  Colorectal cancer.  Skin cancer.  Lung cancer. What should I know about heart disease, diabetes, and high blood  pressure? Blood pressure and heart disease  High blood pressure causes heart disease and increases the risk of stroke. This is more likely to develop in people who have high blood pressure readings, are of African descent, or are overweight.  Have your blood pressure checked: ? Every 3-5 years if you are 18-39 years of age. ? Every year if you are 40 years old or older. Diabetes Have regular diabetes screenings. This checks your fasting blood sugar level. Have the screening done:  Once every three years after age 40 if you are at a normal weight and have a low risk for diabetes.  More often and at a younger age if you are overweight or have a high risk for diabetes. What should I know about preventing infection? Hepatitis B If you have a higher risk for hepatitis B, you should be screened for this virus. Talk with your health care provider to find out if you are at risk for hepatitis B infection. Hepatitis C Testing is recommended for:  Everyone born from 1945 through 1965.  Anyone with known risk factors for hepatitis C. Sexually transmitted infections (STIs)  Get screened for STIs, including gonorrhea and chlamydia, if: ? You are sexually active and are younger than 47 years of age. ? You are older than 47 years of age and your health care provider tells you that you are at risk for this type of infection. ? Your sexual activity has changed since you were last screened, and you are at increased risk for chlamydia or gonorrhea. Ask your health care provider if   you are at risk.  Ask your health care provider about whether you are at high risk for HIV. Your health care provider may recommend a prescription medicine to help prevent HIV infection. If you choose to take medicine to prevent HIV, you should first get tested for HIV. You should then be tested every 3 months for as long as you are taking the medicine. Pregnancy  If you are about to stop having your period (premenopausal) and  you may become pregnant, seek counseling before you get pregnant.  Take 400 to 800 micrograms (mcg) of folic acid every day if you become pregnant.  Ask for birth control (contraception) if you want to prevent pregnancy. Osteoporosis and menopause Osteoporosis is a disease in which the bones lose minerals and strength with aging. This can result in bone fractures. If you are 65 years old or older, or if you are at risk for osteoporosis and fractures, ask your health care provider if you should:  Be screened for bone loss.  Take a calcium or vitamin D supplement to lower your risk of fractures.  Be given hormone replacement therapy (HRT) to treat symptoms of menopause. Follow these instructions at home: Lifestyle  Do not use any products that contain nicotine or tobacco, such as cigarettes, e-cigarettes, and chewing tobacco. If you need help quitting, ask your health care provider.  Do not use street drugs.  Do not share needles.  Ask your health care provider for help if you need support or information about quitting drugs. Alcohol use  Do not drink alcohol if: ? Your health care provider tells you not to drink. ? You are pregnant, may be pregnant, or are planning to become pregnant.  If you drink alcohol: ? Limit how much you use to 0-1 drink a day. ? Limit intake if you are breastfeeding.  Be aware of how much alcohol is in your drink. In the [place] one drink equals one 12 oz bottle of beer (355 mL), one 5 oz glass of wine (148 mL), or one 1 oz glass of hard liquor (44 mL). General instructions  Schedule regular health, dental, and eye exams.  Stay current with your vaccines.  Tell your health care provider if: ? You often feel depressed. ? You have ever been abused or do not feel safe at home. Summary  Adopting a healthy lifestyle and getting preventive care are important in promoting health and wellness.  Follow your health care provider's instructions about healthy  diet, exercising, and getting tested or screened for diseases.  Follow your health care provider's instructions on monitoring your cholesterol and blood pressure. This information is not intended to replace advice given to you by your health care provider. Make sure you discuss any questions you have with your health care provider. Document Revised: 06/26/2018 Document Reviewed: 06/26/2018 Elsevier Patient Education  2020 Elsevier Inc.  

## 2020-07-20 NOTE — Progress Notes (Signed)
Subjective:  Patient ID: Michelle Beltran, female    DOB: [birth year]  Age: 45 y.o. MRN: [id number]  CC: Annual Exam  This visit occurred during the SARS-CoV-2 public health emergency.  Safety protocols were in place, including screening questions prior to the visit, additional usage of staff PPE, and extensive cleaning of exam room while observing appropriate contact time as indicated for disinfecting solutions.    HPI Michelle Beltran presents for establishing.  She tells me that she is seeing a holistic practitioner and is being evaluated for adrenal fatigue.  She would like to have her TFTs checked.  She also tells me that she has a history of a positive ANA and would like to have that rechecked.  She has chronic neck and back pain from degenerative disc disease.  She denies joint pain or swelling.  History Michelle Beltran has a past medical history of Allergy, Anxiety, Asthma, Degenerative disc disease, lumbar, History of PCOS, STD (sexually transmitted disease) (1995), Urinary incontinence, and Vitamin D deficiency.   She has a past surgical history that includes Breast surgery; finger nerve reconstruction (Left, 2013); and Induced abortion (2005).   Her family history includes Arthritis in her father; Heart disease in her paternal grandfather and paternal grandmother; Hyperlipidemia in her father and mother; Hypertension in her father and mother; Osteoporosis in her mother.She reports that she has quit smoking. Her smoking use included cigarettes. She quit after 20.00 years of use. She has never used smokeless tobacco. She reports current alcohol use. She reports previous drug use. Drug: Marijuana.  Outpatient Medications Prior to Visit  Medication Sig Dispense Refill  . albuterol (PROVENTIL HFA;VENTOLIN HFA) 108 (90 BASE) MCG/ACT inhaler Inhale 2 puffs into the lungs every 4 (four) hours as needed for wheezing. 1 Inhaler 0  . CALCIUM PO Take by mouth daily.    .  Cholecalciferol (VITAMIN D3) 125 MCG (5000 UT) CAPS Take by mouth. Take 5000 units 5 days weekly.    . fexofenadine (ALLEGRA) 180 MG tablet Take 180 mg by mouth daily as needed.     . TURMERIC CURCUMIN PO Take by mouth daily.    Marland Kitchen BEE POLLEN PO Take 3 capsules by mouth daily.    Marland Kitchen BLACK COHOSH PO Take by mouth daily.    . Chaste Tree (VITEX EXTRACT PO) Take by mouth daily.    Marland Kitchen OVER THE COUNTER MEDICATION 2 (two) times daily.    . Prasterone, DHEA, (DHEA ADVANCED FORMULA PO) Take by mouth daily.     No facility-administered medications prior to visit.    ROS Review of Systems  Constitutional: Positive for fatigue. Negative for appetite change, chills, diaphoresis and unexpected weight change.  HENT: Negative.   Eyes: Negative.   Respiratory: Negative for cough, chest tightness, shortness of breath and wheezing.   Cardiovascular: Negative for chest pain, palpitations and leg swelling.  Gastrointestinal: Negative for abdominal pain, constipation, diarrhea, nausea and vomiting.  Endocrine: Negative.  Negative for cold intolerance and heat intolerance.  Genitourinary: Negative.  Negative for difficulty urinating.  Musculoskeletal: Positive for back pain and neck pain. Negative for arthralgias and myalgias.  Skin: Negative for color change, pallor and rash.  Neurological: Negative.  Negative for dizziness, weakness, light-headedness and headaches.  Hematological: Negative for adenopathy. Does not bruise/bleed easily.  Psychiatric/Behavioral: Negative.     Objective:  BP 104/62   Pulse 74   Temp 98.9 F (37.2 C) (Oral)   Ht 5' 8.5" (1.74 m)   Wt 163  lb (73.9 kg)   SpO2 97%   BMI 24.42 kg/m   Physical Exam Vitals reviewed.  Constitutional:      Appearance: Normal appearance.  HENT:     Nose: Nose normal.     Mouth/Throat:     Mouth: Mucous membranes are moist.  Eyes:     General: No scleral icterus.    Conjunctiva/sclera: Conjunctivae normal.  Neck:     Thyroid: No  thyroid mass, thyromegaly or thyroid tenderness.  Cardiovascular:     Rate and Rhythm: Normal rate and regular rhythm.     Heart sounds: No murmur heard.   Pulmonary:     Effort: Pulmonary effort is normal.     Breath sounds: No stridor. No wheezing, rhonchi or rales.  Abdominal:     General: Abdomen is flat.     Palpations: There is no mass.     Tenderness: There is no abdominal tenderness. There is no guarding.  Musculoskeletal:        General: Normal range of motion.     Cervical back: Neck supple.     Right lower leg: No edema.     Left lower leg: No edema.  Lymphadenopathy:     Cervical: No cervical adenopathy.  Skin:    General: Skin is warm and dry.     Coloration: Skin is not pale.  Neurological:     General: No focal deficit present.     Mental Status: She is alert.  Psychiatric:        Mood and Affect: Mood normal.        Behavior: Behavior normal.     Lab Results  Component Value Date   WBC 7.9 06/30/2020   HGB 13.0 06/30/2020   HCT 38.2 06/30/2020   PLT 236 06/30/2020   GLUCOSE 86 06/30/2020   CHOL 230 (H) 07/20/2020   TRIG 96.0 07/20/2020   HDL 81.50 07/20/2020   LDLCALC 130 (H) 07/20/2020   ALT 10 06/30/2020   AST 17 06/30/2020   NA 141 06/30/2020   K 4.3 06/30/2020   CL 102 06/30/2020   CREATININE 0.69 06/30/2020   BUN 11 06/30/2020   CO2 29 06/30/2020   TSH 0.67 07/20/2020     Assessment & Plan:   Michelle Beltran was seen today for annual exam.  Diagnoses and all orders for this visit:  Other fatigue- Her TPO is very mildly positive but other other TFTs are normal.  I do not think thyroid disease is causing her symptoms. -     Thyroid peroxidase antibody; Future -     Thyroid Panel With TSH; Future -     Thyroid peroxidase antibody -     Thyroid Panel With TSH  ANA positive- Her ANA is negative.  I will check an anti-smooth antibody. -     ANA; Future -     Anti-Smith antibody; Future -     ANA -     Anti-Smith antibody  Encounter for  general adult medical examination with abnormal findings- Exam completed, labs reviewed, she refused the flu vaccine, cancer screenings addressed, patient education was given. -     Lipid panel; Future -     Lipid panel  Visit for screening mammogram -     MM DIGITAL SCREENING BILATERAL; Future   I have discontinued Michelle Beltran's Chaste Tree (VITEX EXTRACT PO), BLACK COHOSH PO, (Prasterone, DHEA, (DHEA ADVANCED FORMULA PO)), OVER THE COUNTER MEDICATION, and BEE POLLEN PO. I am also having her  maintain her fexofenadine, albuterol, Vitamin D3, CALCIUM PO, and TURMERIC CURCUMIN PO.  No orders of the defined types were placed in this encounter.    Follow-up: Return in about 6 months (around 01/17/2021).  Scarlette Calico, MD

## 2020-07-21 DIAGNOSIS — Z1231 Encounter for screening mammogram for malignant neoplasm of breast: Secondary | ICD-10-CM | POA: Insufficient documentation

## 2020-07-22 LAB — THYROID PANEL WITH TSH
Free Thyroxine Index: 2.4 (ref 1.4–3.8)
T3 Uptake: 29 % (ref 22–35)
T4, Total: 8.2 ug/dL (ref 5.1–11.9)
TSH: 0.67 mIU/L

## 2020-07-22 LAB — ANTI-SMITH ANTIBODY: ENA SM Ab Ser-aCnc: <1 AI

## 2020-07-22 LAB — ANA: Anti Nuclear Antibody (ANA): NEGATIVE

## 2020-07-22 LAB — THYROID PEROXIDASE ANTIBODY: Thyroperoxidase Ab SerPl-aCnc: 10 IU/mL — ABNORMAL HIGH (ref <9)

## 2020-07-28 ENCOUNTER — Other Ambulatory Visit: Payer: Self-pay | Admitting: Obstetrics and Gynecology

## 2020-07-28 DIAGNOSIS — Z1231 Encounter for screening mammogram for malignant neoplasm of breast: Secondary | ICD-10-CM

## 2020-09-08 ENCOUNTER — Ambulatory Visit
Admission: RE | Admit: 2020-09-08 | Discharge: 2020-09-08 | Disposition: A | Discharge status: Home / Self Care | Payer: BC Managed Care – PPO | Source: Ambulatory Visit | Attending: Obstetrics and Gynecology | Admitting: Obstetrics and Gynecology

## 2020-09-08 ENCOUNTER — Other Ambulatory Visit: Payer: Self-pay

## 2020-09-08 DIAGNOSIS — Z1231 Encounter for screening mammogram for malignant neoplasm of breast: Secondary | ICD-10-CM

## 2020-09-16 ENCOUNTER — Ambulatory Visit: Payer: BC Managed Care – PPO | Admitting: Obstetrics and Gynecology

## 2021-03-23 ENCOUNTER — Ambulatory Visit: Payer: Self-pay

## 2021-03-23 ENCOUNTER — Other Ambulatory Visit: Payer: Self-pay

## 2021-03-23 ENCOUNTER — Ambulatory Visit: Payer: BC Managed Care – PPO | Admitting: Orthopaedic Surgery

## 2021-03-23 DIAGNOSIS — M25522 Pain in left elbow: Secondary | ICD-10-CM

## 2021-03-23 NOTE — Progress Notes (Signed)
Office Visit Note   Patient: Michelle Beltran           Date of Birth: 03/28/1976           MRN: [id number] Visit Date: 03/23/2021              Requested by: Janith Lima, MD [address] [place],  [place] [address] PCP: Janith Lima, MD   Assessment & Plan: Visit Diagnoses:  1. Pain in left elbow     Plan: Based on findings impression is left elbow lateral epicondylitis.  Treatment options discussed and we will start with home exercises and stretches with resistance bands.  Counterforce brace provided today.  She will pick up Voltaren gel from the pharmacy.  Oral NSAIDs as needed.  Will hold off on injections for now.  Follow-Up Instructions: Return if symptoms worsen or fail to improve.   Orders:  Orders Placed This Encounter  Procedures   XR Elbow 2 Views Left   No orders of the defined types were placed in this encounter.     Procedures: No procedures performed   Clinical Data: No additional findings.   Subjective: Chief Complaint  Patient presents with   Left Elbow - Pain    Michelle Beltran comes in today for evaluation of left elbow pain for about 3 to 4 weeks without any injuries.  She states that she woke up with the pain 1 day.  The pain is worse with lifting weights or with any use of the left hand.  She is right-hand dominant.  Denies any numbness and tingling.  She has been using Tiger balm and ice without significant relief.   Review of Systems  Constitutional: Negative.   HENT: Negative.    Eyes: Negative.   Respiratory: Negative.    Cardiovascular: Negative.   Endocrine: Negative.   Musculoskeletal: Negative.   Neurological: Negative.   Hematological: Negative.   Psychiatric/Behavioral: Negative.    All other systems reviewed and are negative.   Objective: Vital Signs: There were no vitals taken for this visit.  Physical Exam Vitals and nursing note reviewed.  Constitutional:      Appearance: She is well-developed.   Pulmonary:     Effort: Pulmonary effort is normal.  Skin:    General: Skin is warm.     Capillary Refill: Capillary refill takes less than 2 seconds.  Neurological:     Mental Status: She is alert and oriented to person, place, and time.  Psychiatric:        Behavior: Behavior normal.        Thought Content: Thought content normal.        Judgment: Judgment normal.    Ortho Exam Left elbow shows point tenderness to the lateral epicondyle.  Pain with resisted wrist extension and long finger extension.  Radial tunnel is nontender.  Full range of motion of the elbow.  Pain with grasping and making a fist.  Specialty Comments:  No specialty comments available.  Imaging: XR Elbow 2 Views Left  Result Date: 03/23/2021 No acute or structural abnormalities    PMFS History: Patient Active Problem List   Diagnosis Date Noted   Visit for screening mammogram 07/21/2020   Other fatigue 07/20/2020   ANA positive 07/20/2020   Encounter for general adult medical examination with abnormal findings 07/20/2020   Past Medical History:  Diagnosis Date   Allergy    dogs and cats   Anxiety    Asthma  Degenerative disc disease, lumbar    History of PCOS    STD (sexually transmitted disease) 1995   tx'd for gonorrhea   Urinary incontinence    Vitamin D deficiency     Family History  Problem Relation Age of Onset   Osteoporosis Mother    Hypertension Mother    Hyperlipidemia Mother    Hypertension Father    Hyperlipidemia Father    Arthritis Father    Heart disease Paternal Grandmother    Heart disease Paternal Grandfather     Past Surgical History:  Procedure Laterality Date   BREAST SURGERY     Reduction   finger nerve reconstruction Left 2013   left hand, 4th digit.   INDUCED ABORTION  2005   Social History   Occupational History   Not on file  Tobacco Use   Smoking status: Former    Years: 20.00    Types: Cigarettes   Smokeless tobacco: Never  Vaping Use    Vaping Use: Every day  Substance and Sexual Activity   Alcohol use: Yes    Comment: occ   Drug use: Not Currently    Types: Marijuana    Comment: occ.   Sexual activity: Yes    Partners: Male    Birth control/protection: None

## 2021-05-02 ENCOUNTER — Encounter: Payer: Self-pay | Admitting: Internal Medicine

## 2021-05-02 NOTE — Telephone Encounter (Signed)
error 

## 2021-05-11 ENCOUNTER — Encounter: Payer: Self-pay | Admitting: Internal Medicine

## 2021-05-11 ENCOUNTER — Other Ambulatory Visit: Payer: Self-pay

## 2021-05-11 ENCOUNTER — Ambulatory Visit: Payer: BC Managed Care – PPO | Admitting: Internal Medicine

## 2021-05-11 VITALS — BP 116/64 | HR 76 | Temp 98.4°F | Ht 68.5 in | Wt 155.0 lb

## 2021-05-11 DIAGNOSIS — E785 Hyperlipidemia, unspecified: Secondary | ICD-10-CM | POA: Diagnosis not present

## 2021-05-11 DIAGNOSIS — J4520 Mild intermittent asthma, uncomplicated: Secondary | ICD-10-CM

## 2021-05-11 DIAGNOSIS — R768 Other specified abnormal immunological findings in serum: Secondary | ICD-10-CM | POA: Diagnosis not present

## 2021-05-11 DIAGNOSIS — E2839 Other primary ovarian failure: Secondary | ICD-10-CM | POA: Diagnosis not present

## 2021-05-11 LAB — TSH: TSH: 0.6 u[IU]/mL (ref 0.35–5.50)

## 2021-05-11 LAB — LIPID PANEL
Cholesterol: 215 mg/dL — ABNORMAL HIGH (ref 0–200)
HDL: 86.7 mg/dL (ref >39.00)
LDL Cholesterol: 101 mg/dL — ABNORMAL HIGH (ref 0–99)
NonHDL: 128.57
Total CHOL/HDL Ratio: 2
Triglycerides: 136 mg/dL (ref 0.0–149.0)
VLDL: 27.2 mg/dL (ref 0.0–40.0)

## 2021-05-11 LAB — LUTEINIZING HORMONE: LH: 19.74 m[IU]/mL

## 2021-05-11 LAB — FOLLICLE STIMULATING HORMONE: FSH: 60 m[IU]/mL

## 2021-05-11 MED ORDER — BUDESONIDE-FORMOTEROL FUMARATE 80-4.5 MCG/ACT IN AERO: 2.0000 | INHALATION_SPRAY | Freq: Two times a day (BID) | RESPIRATORY_TRACT | 1 refills | Status: DC

## 2021-05-11 MED ORDER — ALBUTEROL SULFATE HFA 108 (90 BASE) MCG/ACT IN AERS: 2.0000 | INHALATION_SPRAY | RESPIRATORY_TRACT | 5 refills | Status: AC | PRN

## 2021-05-11 NOTE — Patient Instructions (Signed)
Asthma, Adult °Asthma is a long-term (chronic) condition that causes recurrent episodes in which the airways become tight and narrow. The airways are the passages that lead from the nose and mouth down into the lungs. Asthma episodes, also called asthma attacks, can cause coughing, wheezing, shortness of breath, and chest pain. The airways can also fill with mucus. During an attack, it can be difficult to breathe. Asthma attacks can range from minor to life threatening. °Asthma cannot be cured, but medicines and lifestyle changes can help control it and treat acute attacks. °What are the causes? °This condition is believed to be caused by inherited (genetic) and environmental factors, but its exact cause is not known. °There are many things that can bring on an asthma attack or make asthma symptoms worse (triggers). Asthma triggers are different for each person. Common triggers include: °Mold. °Dust. °Cigarette smoke. °Cockroaches. °Things that can cause allergy symptoms (allergens), such as animal dander or pollen from trees or grass. °Air pollutants such as household cleaners, wood smoke, smog, or chemical odors. °Cold air, weather changes, and winds (which increase molds and pollen in the air). °Strong emotional expressions such as crying or laughing hard. °Stress. °Certain medicines (such as aspirin) or types of medicines (such as beta-blockers). °Sulfites in foods and drinks. Foods and drinks that may contain sulfites include dried fruit, potato chips, and sparkling grape juice. °Infections or inflammatory conditions such as the flu, a cold, or inflammation of the nasal membranes (rhinitis). °Gastroesophageal reflux disease (GERD). °Exercise or strenuous activity. °What are the signs or symptoms? °Symptoms of this condition may occur right after asthma is triggered or many hours later. Symptoms include: °Wheezing. This can sound like whistling when you breathe. °Excessive nighttime or early morning  coughing. °Frequent or severe coughing with a common cold. °Chest tightness. °Shortness of breath. °Tiredness (fatigue) with minimal activity. °How is this diagnosed? °This condition is diagnosed based on: °Your medical history. °A physical exam. °Tests, which may include: °Lung function studies and pulmonary studies (spirometry). These tests can evaluate the flow of air in your lungs. °Allergy tests. °Imaging tests, such as X-rays. °How is this treated? °There is no cure for this condition, but treatment can help control your symptoms. Treatment for asthma usually involves: °Identifying and avoiding your asthma triggers. °Using medicines to control your symptoms. Generally, two types of medicines are used to treat asthma: °Controller medicines. These help prevent asthma symptoms from occurring. They are usually taken every day. °Fast-acting reliever or rescue medicines. These quickly relieve asthma symptoms by widening the narrow and tight airways. They are used as needed and provide short-term relief. °Using supplemental oxygen. This may be needed during a severe episode. °Using other medicines, such as: °Allergy medicines, such as antihistamines, if your asthma attacks are triggered by allergens. °Immune medicines (immunomodulators). These are medicines that help control the immune system. °Creating an asthma action plan. An asthma action plan is a written plan for managing and treating your asthma attacks. This plan includes: °A list of your asthma triggers and how to avoid them. °Information about when medicines should be taken and when their dosage should be changed. °Instructions about using a device called a peak flow meter. A peak flow meter measures how well the lungs are working and the severity of your asthma. It helps you monitor your condition. °Follow these instructions at home: °Controlling your home environment °Control your home environment in the following ways to help avoid triggers and prevent  asthma attacks: °Change your heating   and air conditioning filter regularly. °Limit your use of fireplaces and wood stoves. °Get rid of pests (such as roaches and mice) and their droppings. °Throw away plants if you see mold on them. °Clean floors and dust surfaces regularly. Use unscented cleaning products. °Try to have someone else vacuum for you regularly. Stay out of rooms while they are being vacuumed and for a short while afterward. If you vacuum, use a dust mask from a hardware store, a double-layered or microfilter vacuum cleaner bag, or a vacuum cleaner with a HEPA filter. °Replace carpet with wood, tile, or vinyl flooring. Carpet can trap dander and dust. °Use allergy-proof pillows, mattress covers, and box spring covers. °Keep your bedroom a trigger-free room. °Avoid pets and keep windows closed when allergens are in the air. °Wash beddings every week in hot water and dry them in a dryer. °Use blankets that are made of polyester or cotton. °Clean bathrooms and kitchens with bleach. If possible, have someone repaint the walls in these rooms with mold-resistant paint. Stay out of the rooms that are being cleaned and painted. °Wash your hands often with soap and water. If soap and water are not available, use hand sanitizer. °Do not allow anyone to smoke in your home. °General instructions °Take over-the-counter and prescription medicines only as told by your health care provider. °Speak with your health care provider if you have questions about how or when to take the medicines. °Make note if you are requiring more frequent dosages. °Do not use any products that contain nicotine or tobacco, such as cigarettes and e-cigarettes. If you need help quitting, ask your health care provider. Also, avoid being exposed to secondhand smoke. °Use a peak flow meter as told by your health care provider. Record and keep track of the readings. °Understand and use the asthma action plan to help minimize, or stop an asthma  attack, without needing to seek medical care. °Make sure you stay up to date on your yearly vaccinations as told by your health care provider. This may include vaccines for the flu and pneumonia. °Avoid outdoor activities when allergen counts are high and when air quality is low. °Wear a ski mask that covers your nose and mouth during outdoor winter activities. Exercise indoors on cold days if you can. °Warm up before exercising, and take time for a cool-down period after exercise. °Keep all follow-up visits as told by your health care provider. This is important. °Where to find more information °For information about asthma, turn to the Centers for Disease Control and Prevention at www.cdc.gov/asthma/faqs °For air quality information, turn to AirNow at airnow.gov °Contact a health care provider if: °You have wheezing, shortness of breath, or a cough even while you are taking medicine to prevent attacks. °The mucus you cough up (sputum) is thicker than usual. °Your sputum changes from clear or white to yellow, green, gray, or bloody. °Your medicines are causing side effects, such as a rash, itching, swelling, or trouble breathing. °You need to use a reliever medicine more than 2-3 times a week. °Your peak flow reading is still at 50-79% of your personal best after following your action plan for 1 hour. °You have a fever. °Get help right away if: °You are getting worse and do not respond to treatment during an asthma attack. °You are short of breath when at rest or when doing very little physical activity. °You have difficulty eating, drinking, or talking. °You have chest pain or tightness. °You develop a fast heartbeat or   palpitations. °You have a bluish color to your lips or fingernails. °You are light-headed or dizzy, or you faint. °Your peak flow reading is less than 50% of your personal best. °You feel too tired to breathe normally. °Summary °Asthma is a long-term (chronic) condition that causes recurrent  episodes in which the airways become tight and narrow. These episodes can cause coughing, wheezing, shortness of breath, and chest pain. °Asthma cannot be cured, but medicines and lifestyle changes can help control it and treat acute attacks. °Make sure you understand how to avoid triggers and how and when to use your medicines. °Asthma attacks can range from minor to life threatening. Get help right away if you have an asthma attack and do not respond to treatment with your usual rescue medicines. °This information is not intended to replace advice given to you by your health care provider. Make sure you discuss any questions you have with your health care provider. °Document Revised: 04/02/2020 Document Reviewed: 11/05/2019 °Elsevier Patient Education © 2022 [employer] ° °

## 2021-05-11 NOTE — Progress Notes (Signed)
Subjective:  Patient ID: Michelle Beltran, female    DOB: [birth year]  Age: 45 y.o. MRN: [id number]  CC: Asthma  This visit occurred during the SARS-CoV-2 public health emergency.  Safety protocols were in place, including screening questions prior to the visit, additional usage of staff PPE, and extensive cleaning of exam room while observing appropriate contact time as indicated for disinfecting solutions.    HPI Michelle Beltran presents for f/up -  She has had asthma off and on for 20 years.  Her symptoms are exacerbated in the spring and fall and whenever she is exposed to cats or dogs.  She has recently had a mild nonproductive cough and wheezing.  She denies chest pain, shortness of breath, fever, chills, or night sweats.  She also has a history of a positive ANA.  She has previously seen rheumatology.  She wants to have her ANA repeated.  Additionally, she has not had a menstrual cycle in 3 years and wants to know if she has been through menopause.  Outpatient Medications Prior to Visit  Medication Sig Dispense Refill   CALCIUM PO Take by mouth daily.     Cholecalciferol (VITAMIN D3) 125 MCG (5000 UT) CAPS Take by mouth. Take 5000 units 5 days weekly.     fexofenadine (ALLEGRA) 180 MG tablet Take 180 mg by mouth daily as needed.      TURMERIC CURCUMIN PO Take by mouth daily.     albuterol (PROVENTIL HFA;VENTOLIN HFA) 108 (90 BASE) MCG/ACT inhaler Inhale 2 puffs into the lungs every 4 (four) hours as needed for wheezing. 1 Inhaler 0   No facility-administered medications prior to visit.    ROS Review of Systems  Constitutional:  Negative for chills, diaphoresis, fatigue and fever.  HENT: Negative.    Eyes: Negative.   Respiratory:  Positive for cough and wheezing. Negative for chest tightness and shortness of breath.   Cardiovascular:  Negative for chest pain, palpitations and leg swelling.  Gastrointestinal:  Negative for abdominal pain, constipation,  diarrhea, nausea and vomiting.  Endocrine: Negative.   Genitourinary: Negative.  Negative for difficulty urinating.  Musculoskeletal:  Positive for arthralgias. Negative for back pain, myalgias and neck pain.  Skin: Negative.   Neurological: Negative.  Negative for dizziness, light-headedness and headaches.  Hematological:  Negative for adenopathy. Does not bruise/bleed easily.  Psychiatric/Behavioral: Negative.     Objective:  BP 116/64 (BP Location: Right Arm, Patient Position: Sitting, Cuff Size: Large)   Pulse 76   Temp 98.4 F (36.9 C) (Oral)   Ht 5' 8.5" (1.74 m)   Wt 155 lb (70.3 kg)   SpO2 96%   BMI 23.22 kg/m   BP Readings from Last 3 Encounters:  05/11/21 116/64  07/20/20 104/62  07/01/20 110/70    Wt Readings from Last 3 Encounters:  05/11/21 155 lb (70.3 kg)  07/20/20 163 lb (73.9 kg)  07/01/20 161 lb (73 kg)    Physical Exam  Lab Results  Component Value Date   WBC 7.9 06/30/2020   HGB 13.0 06/30/2020   HCT 38.2 06/30/2020   PLT 236 06/30/2020   GLUCOSE 86 06/30/2020   CHOL 215 (H) 05/11/2021   TRIG 136.0 05/11/2021   HDL 86.70 05/11/2021   LDLCALC 101 (H) 05/11/2021   ALT 10 06/30/2020   AST 17 06/30/2020   NA 141 06/30/2020   K 4.3 06/30/2020   CL 102 06/30/2020   CREATININE 0.69 06/30/2020   BUN 11 06/30/2020  CO2 29 06/30/2020   TSH 0.60 05/11/2021    MM 3D SCREEN BREAST BILATERAL  Result Date: 09/12/2020 CLINICAL DATA:  Screening. EXAM: DIGITAL SCREENING BILATERAL MAMMOGRAM WITH TOMOSYNTHESIS AND CAD TECHNIQUE: Bilateral screening digital craniocaudal and mediolateral oblique mammograms were obtained. Bilateral screening digital breast tomosynthesis was performed. The images were evaluated with computer-aided detection. COMPARISON:  Previous exam(s). ACR Breast Density Category c: The breast tissue is heterogeneously dense, which may obscure small masses. FINDINGS: There are no findings suspicious for malignancy. IMPRESSION: No mammographic  evidence of malignancy. A result letter of this screening mammogram will be mailed directly to the patient. RECOMMENDATION: Screening mammogram in one year. (Code:SM-B-01Y) BI-RADS CATEGORY  1: Negative. Electronically Signed   By: Lillia Mountain M.D.   On: 09/12/2020 10:37   Component Ref Range & Units 2 d ago   ANA Titer 1 titer 1:40 High    Comment: A low level ANA titer may be present in pre-clinical  autoimmune diseases and normal individuals.                  Reference Range                  <1:40        Negative                  1:40-1:80    Low Antibody Level                  >1:80        Elevated Antibody Level  .   ANA Pattern 1  Nuclear, Speckled Abnormal      Assessment & Plan:   Michelle Beltran was seen today for asthma.  Diagnoses and all orders for this visit:  Mild intermittent asthma without complication- I recommended that she start using a LABA/ICS/SABA combination. She refused a flux and pneumonia vaccine. -     albuterol (VENTOLIN HFA) 108 (90 Base) MCG/ACT inhaler; Inhale 2 puffs into the lungs every 4 (four) hours as needed for wheezing. -     budesonide-formoterol (SYMBICORT) 80-4.5 MCG/ACT inhaler; Inhale 2 puffs into the lungs 2 (two) times daily.  ANA positive- Her ANA is positive again with a speckled pattern.  I recommended that she continue to follow-up with rheumatology. -     ANA; Future -     ANA  Premature ovarian failure- Her TSH is normal but her FSH and LH are consistent with the postmenopausal state. -     TSH; Future -     Follicle stimulating hormone; Future -     Luteinizing hormone; Future -     Luteinizing hormone -     Follicle stimulating hormone -     TSH  Hyperlipidemia with target LDL less than 160- Statin therapy is not indicated. -     Lipid panel; Future -     Lipid panel  Other orders -     Anti-nuclear ab-titer (ANA titer)  I have changed Michelle Beltran's albuterol. I am also having her start on budesonide-formoterol.  Additionally, I am having her maintain her fexofenadine, Vitamin D3, CALCIUM PO, and TURMERIC CURCUMIN PO.  Meds ordered this encounter  Medications   albuterol (VENTOLIN HFA) 108 (90 Base) MCG/ACT inhaler    Sig: Inhale 2 puffs into the lungs every 4 (four) hours as needed for wheezing.    Dispense:  1 each    Refill:  5   budesonide-formoterol (SYMBICORT) 80-4.5 MCG/ACT inhaler  Sig: Inhale 2 puffs into the lungs 2 (two) times daily.    Dispense:  3 each    Refill:  1     Follow-up: Return in about 6 months (around 11/09/2021).  Scarlette Calico, MD

## 2021-05-13 LAB — ANTI-NUCLEAR AB-TITER (ANA TITER): ANA Titer 1: 1:40 {titer} — ABNORMAL HIGH

## 2021-05-13 LAB — ANA: Anti Nuclear Antibody (ANA): POSITIVE — AB

## 2021-06-03 ENCOUNTER — Ambulatory Visit: Payer: BC Managed Care – PPO | Admitting: Obstetrics and Gynecology

## 2021-06-03 ENCOUNTER — Other Ambulatory Visit: Payer: Self-pay

## 2021-06-03 ENCOUNTER — Encounter: Payer: Self-pay | Admitting: Obstetrics and Gynecology

## 2021-06-03 VITALS — BP 118/70 | Ht 67.0 in | Wt 155.0 lb

## 2021-06-03 DIAGNOSIS — Z01419 Encounter for gynecological examination (general) (routine) without abnormal findings: Secondary | ICD-10-CM

## 2021-06-03 MED ORDER — ESTRADIOL ACETATE 0.05 MG/24HR VA RING: 1.0000 | VAGINAL_RING | VAGINAL | 0 refills | Status: DC

## 2021-06-03 MED ORDER — PROGESTERONE MICRONIZED 100 MG PO CAPS: 100.0000 mg | ORAL_CAPSULE | Freq: Every day | ORAL | 0 refills | Status: DC

## 2021-06-03 NOTE — Patient Instructions (Signed)

## 2021-06-03 NOTE — Progress Notes (Signed)
GYNECOLOGY  VISIT   HPI: 45 y.o.   Married  [place] female   [place] with Patient's last menstrual period was 12/16/2019.   here for  Discuss HRT -hot flashes   She would like to do HRT.  Has hot flashes. Night sweats.    Dry vagina, and this is what bothers her the most.  Decreased energy. 40% of normal.  Using a supplemental creams, including one with progesterone cream.   PCP measured FSH 60.0. Positive ANA.   GYNECOLOGIC HISTORY: Patient's last menstrual period was 12/16/2019. Contraception:  Post menopausal Menopausal hormone therapy:  None Last mammogram:  09-08-20 normal Last pap smear:   2019. [facility] Flu vaccine:  not taken.  Covid bivalent: completed.  Colonoscopy last year - normal.         OB History     Gravida  1   Para  0   Term  0   Preterm  0   AB  1   Living         SAB  0   IAB  0   Ectopic  0   Multiple      Live Births                 Patient Active Problem List   Diagnosis Date Noted   Mild intermittent asthma without complication 95/62/1308   Premature ovarian failure 05/11/2021   Hyperlipidemia with target LDL less than 160 05/11/2021   Visit for screening mammogram 07/21/2020   Other fatigue 07/20/2020   ANA positive 07/20/2020   Encounter for general adult medical examination with abnormal findings 07/20/2020    Past Medical History:  Diagnosis Date   Allergy    dogs and cats   Anxiety    Asthma    Degenerative disc disease, lumbar    History of PCOS    STD (sexually transmitted disease) 1995   tx'd for gonorrhea   Urinary incontinence    Vitamin D deficiency     Past Surgical History:  Procedure Laterality Date   BREAST SURGERY     Reduction   finger nerve reconstruction Left 2013   left hand, 4th digit.   INDUCED ABORTION  2005    Current Outpatient Medications  Medication Sig Dispense Refill   albuterol (VENTOLIN HFA) 108 (90 Base) MCG/ACT inhaler Inhale 2 puffs into the lungs every 4  (four) hours as needed for wheezing. 1 each 5   CALCIUM PO Take by mouth daily.     Cholecalciferol (VITAMIN D3) 125 MCG (5000 UT) CAPS Take by mouth. Take 5000 units 5 days weekly.     fexofenadine (ALLEGRA) 180 MG tablet Take 180 mg by mouth daily as needed.      TURMERIC CURCUMIN PO Take by mouth daily.     UNABLE TO FIND Med Name: [employer] supplement     budesonide-formoterol (SYMBICORT) 80-4.5 MCG/ACT inhaler Inhale 2 puffs into the lungs 2 (two) times daily. (Patient not taking: Reported on 06/03/2021) 3 each 1   No current facility-administered medications for this visit.     ALLERGIES: Cat hair extract  Family History  Problem Relation Age of Onset   Osteoporosis Mother    Hypertension Mother    Hyperlipidemia Mother    Hypertension Father    Hyperlipidemia Father    Arthritis Father    Heart disease Paternal Grandmother    Heart disease Paternal Grandfather     Social History   Socioeconomic History   Marital status: Married  Spouse name: Not on file   Number of children: Not on file   Years of education: Not on file   Highest education level: Not on file  Occupational History   Not on file  Tobacco Use   Smoking status: Former    Years: 20.00    Types: Cigarettes   Smokeless tobacco: Never  Vaping Use   Vaping Use: Every day  Substance and Sexual Activity   Alcohol use: Yes    Comment: occ   Drug use: Not Currently    Types: Marijuana    Comment: occ.   Sexual activity: Yes    Partners: Male  Other Topics Concern   Not on file  Social History Narrative   ** Merged History Encounter **       Social Determinants of Health   Financial Resource Strain: Not on file  Food Insecurity: Not on file  Transportation Needs: Not on file  Physical Activity: Not on file  Stress: Not on file  Social Connections: Not on file  Intimate Partner Violence: Not on file    Review of Systems  PHYSICAL EXAMINATION:    BP 118/70 (BP Location: Right Arm,  Patient Position: Sitting, Cuff Size: Normal)   LMP 12/16/2019     General appearance: alert, cooperative and appears stated age Head: Normocephalic, without obvious abnormality, atraumatic Neck: no adenopathy, supple, symmetrical, trachea midline and thyroid normal to inspection and palpation Lungs: clear to auscultation bilaterally Breasts: normal appearance, no masses or tenderness, No nipple retraction or dimpling, No nipple discharge or bleeding, No axillary or supraclavicular adenopathy Heart: regular rate and rhythm Abdomen: soft, non-tender, no masses,  no organomegaly Extremities: extremities normal, atraumatic, no cyanosis or edema Skin: Skin color, texture, turgor normal. No rashes or lesions Lymph nodes: Cervical, supraclavicular, and axillary nodes normal. No abnormal inguinal nodes palpated Neurologic: Grossly normal  Pelvic: External genitalia:  no lesions              Urethra:  normal appearing urethra with no masses, tenderness or lesions              Bartholins and Skenes: normal                 Vagina: normal appearing vagina with normal color and discharge, no lesions              Cervix: no lesions.. No pap.                 Bimanual Exam:  Uterus:  normal size, contour, position, consistency, mobility, non-tender              Adnexa: no mass, fullness, tenderness              Rectal exam: Yes.  .  Confirms.              Anus:  normal sphincter tone, no lesions  Chaperone was present for exam:  Onalee Hua, CMA  ASSESSMENT  Well woman with GYN examination.  Menopausal symptoms.    PLAN  Mammogram yearly.  Self breast exam reviewed.  We discussed flu vaccine.  Labs were done with PCP.  Discused WHI and use of HRT which can increase risk of PE, DVT, MI, stroke and breast cancer.  Femring 0.05 mg/24 hr, per vagina every 90 days, Disp:  1, RF:  none. Prometrium 100 mg po nightly, Disp:  #90, RF:  none. I educated her that she may experience some vaginal bleeding  during  the first 3 months of treatment.  FU in 3 months for a recheck.     An After Visit Summary was printed and given to the patient.

## 2021-06-07 ENCOUNTER — Telehealth: Payer: Self-pay | Admitting: *Deleted

## 2021-06-07 MED ORDER — ESTRADIOL 0.05 MG/24HR TD PTTW: 1.0000 | MEDICATED_PATCH | TRANSDERMAL | 0 refills | Status: DC

## 2021-06-07 NOTE — Telephone Encounter (Signed)
Patient informed Rx sent. Aware she will need a 3 month follow up

## 2021-06-07 NOTE — Telephone Encounter (Signed)
Patient called insurance denied coverage for femring 0.05 mg (not covered). Pharmacy told her that estradiol, imvexxy would be an option for alternative medications. Please advise

## 2021-06-07 NOTE — Telephone Encounter (Signed)
I recommend she use Vivelle Dot 0.05 mg to skin of lower abdomen twice a week.  Disp:  24 RF:  none.   The Imvexxy is not equivalent to the Femring.   Please discontinue the Femring.   The Vivelle Dot may be adequate for positive vaginal effect without adding an additional form of estrogen to her regimen.   I recommend trying the Vivelle Dot and the Prometrium and seeing how she is doing at her 3 month follow up appointment.

## 2021-08-10 ENCOUNTER — Other Ambulatory Visit: Payer: Self-pay | Admitting: Obstetrics and Gynecology

## 2021-08-10 ENCOUNTER — Ambulatory Visit (INDEPENDENT_AMBULATORY_CARE_PROVIDER_SITE_OTHER): Payer: BC Managed Care – PPO | Admitting: Obstetrics and Gynecology

## 2021-08-10 ENCOUNTER — Other Ambulatory Visit: Payer: Self-pay

## 2021-08-10 ENCOUNTER — Encounter: Payer: Self-pay | Admitting: Obstetrics and Gynecology

## 2021-08-10 ENCOUNTER — Ambulatory Visit: Payer: BC Managed Care – PPO | Admitting: Obstetrics and Gynecology

## 2021-08-10 VITALS — BP 112/62 | HR 82 | Ht 67.0 in | Wt 155.0 lb

## 2021-08-10 DIAGNOSIS — Z79890 Hormone replacement therapy (postmenopausal): Secondary | ICD-10-CM | POA: Diagnosis not present

## 2021-08-10 DIAGNOSIS — Z5181 Encounter for therapeutic drug level monitoring: Secondary | ICD-10-CM | POA: Diagnosis not present

## 2021-08-10 DIAGNOSIS — Z1231 Encounter for screening mammogram for malignant neoplasm of breast: Secondary | ICD-10-CM

## 2021-08-10 MED ORDER — PROGESTERONE MICRONIZED 100 MG PO CAPS: 100.0000 mg | ORAL_CAPSULE | Freq: Every day | ORAL | 3 refills | Status: DC

## 2021-08-10 MED ORDER — ESTRADIOL 0.05 MG/24HR TD PTTW: 1.0000 | MEDICATED_PATCH | TRANSDERMAL | 3 refills | Status: DC

## 2021-08-10 NOTE — Progress Notes (Signed)
GYNECOLOGY  VISIT   HPI: 46 y.o.   Married  [place]  female   Michelle Beltran with Patient's last menstrual period was 01/07/2020 (exact date).   here for medication follow up of hormone therapy.   Patient doing well.  Patient is taking Prometrium 100 mg nightly and using Vivelle Dot 0.05 mg.   No more hot flashes or night sweats.  Increased energy.  No painful intercourse.   Using coconut oil.  Not sleeping very well due to work situation.  She has a team of support for this.  Some breast swelling, which is better now.  No vaginal bleeding.   GYNECOLOGIC HISTORY: Patient's last menstrual period was 01/07/2020 (exact date). Contraception:  PMP Menopausal hormone therapy:  Vivelle Dot 0.05mg , Prometrium 100mg  Last mammogram:  09-08-20 Neg/[facility].  Has appointment.  Last pap smear:  2019 normal w/[facility], 04-17-16 Neg:Neg HR HPV        OB History     Gravida  1   Para  0   Term  0   Preterm  0   AB  1   Living         SAB  0   IAB  0   Ectopic  0   Multiple      Live Births                 Patient Active Problem List   Diagnosis Date Noted   Mild intermittent asthma without complication 99/83/3825   Premature ovarian failure 05/11/2021   Hyperlipidemia with target LDL less than 160 05/11/2021   Visit for screening mammogram 07/21/2020   Other fatigue 07/20/2020   ANA positive 07/20/2020   Encounter for general adult medical examination with abnormal findings 07/20/2020    Past Medical History:  Diagnosis Date   Allergy    dogs and cats   Anxiety    Asthma    Degenerative disc disease, lumbar    History of PCOS    STD (sexually transmitted disease) 1995   tx'd for gonorrhea   Urinary incontinence    Vitamin D deficiency     Past Surgical History:  Procedure Laterality Date   BREAST SURGERY     Reduction   finger nerve reconstruction Left 2013   left hand, 4th digit.   INDUCED ABORTION  2005    Current Outpatient Medications   Medication Sig Dispense Refill   albuterol (VENTOLIN HFA) 108 (90 Base) MCG/ACT inhaler Inhale 2 puffs into the lungs every 4 (four) hours as needed for wheezing. 1 each 5   CALCIUM PO Take by mouth daily.     Cholecalciferol (VITAMIN D3) 125 MCG (5000 UT) CAPS Take by mouth. Take 5000 units 5 days weekly.     estradiol (VIVELLE-DOT) 0.05 MG/24HR patch Place 1 patch (0.05 mg total) onto the skin 2 (two) times a week. 24 patch 0   fexofenadine (ALLEGRA) 180 MG tablet Take 180 mg by mouth daily as needed.      Multiple Vitamins-Minerals (ZINC PO) Take 1 tablet by mouth daily.     OVER THE COUNTER MEDICATION Hemagenics Takes 1 tablet a day     progesterone (PROMETRIUM) 100 MG capsule Take 1 capsule (100 mg total) by mouth daily. 90 capsule 0   TURMERIC CURCUMIN PO Take by mouth daily.     No current facility-administered medications for this visit.     ALLERGIES: Cat hair extract  Family History  Problem Relation Age of Onset   Osteoporosis Mother  Hypertension Mother    Hyperlipidemia Mother    Hypertension Father    Hyperlipidemia Father    Arthritis Father    Heart disease Paternal Grandmother    Heart disease Paternal Grandfather     Social History   Socioeconomic History   Marital status: Married    Spouse name: Not on file   Number of children: Not on file   Years of education: Not on file   Highest education level: Not on file  Occupational History   Not on file  Tobacco Use   Smoking status: Former    Years: 20.00    Types: Cigarettes   Smokeless tobacco: Never  Vaping Use   Vaping Use: Every day  Substance and Sexual Activity   Alcohol use: Yes    Comment: occ   Drug use: Not Currently    Types: Marijuana    Comment: occ.   Sexual activity: Yes    Partners: Male  Other Topics Concern   Not on file  Social History Narrative   ** Merged History Encounter **       Social Determinants of Health   Financial Resource Strain: Not on file  Food  Insecurity: Not on file  Transportation Needs: Not on file  Physical Activity: Not on file  Stress: Not on file  Social Connections: Not on file  Intimate Partner Violence: Not on file    Review of Systems  All other systems reviewed and are negative.  PHYSICAL EXAMINATION:    BP 112/62    Pulse 82    Ht 5\' 7"  (1.702 m)    Wt 155 lb (70.3 kg)    LMP 01/07/2020 (Exact Date)    SpO2 98%    BMI 24.28 kg/m     General appearance: alert, cooperative and appears stated age  ASSESSMENT  HRT.  Medication monitoring encounter.  Stress.   PLAN  Patient will continue with Vivelle Dot and Prometrium in current dosages.  Refills given.  I offered support for her stressful work situation.  Fu for annual exam and prn.    An After Visit Summary was printed and given to the patient.  20 min  total time was spent for this patient encounter, including preparation, face-to-face counseling with the patient, coordination of care, and documentation of the encounter.

## 2021-08-12 ENCOUNTER — Telehealth: Payer: Self-pay | Admitting: Orthopaedic Surgery

## 2021-08-17 ENCOUNTER — Ambulatory Visit: Payer: Self-pay

## 2021-08-17 ENCOUNTER — Ambulatory Visit: Payer: BC Managed Care – PPO | Admitting: Orthopaedic Surgery

## 2021-08-17 ENCOUNTER — Other Ambulatory Visit: Payer: Self-pay

## 2021-08-17 ENCOUNTER — Encounter: Payer: Self-pay | Admitting: Orthopaedic Surgery

## 2021-08-17 VITALS — Ht 67.0 in | Wt 155.0 lb

## 2021-08-17 DIAGNOSIS — G8929 Other chronic pain: Secondary | ICD-10-CM | POA: Diagnosis not present

## 2021-08-17 DIAGNOSIS — M545 Low back pain, unspecified: Secondary | ICD-10-CM

## 2021-08-17 DIAGNOSIS — M542 Cervicalgia: Secondary | ICD-10-CM | POA: Diagnosis not present

## 2021-08-17 NOTE — Progress Notes (Signed)
Office Visit Note   Patient: Michelle Beltran           Date of Birth: 08/23/1975           MRN: [id number] Visit Date: 08/17/2021              Requested by: Janith Lima, MD [address] [place],  [place] [address] PCP: Janith Lima, MD   Assessment & Plan: Visit Diagnoses: No diagnosis found.  Plan: Pleasant 46 year old woman with long history of lower back pain of 10+ years.  She is also had a long history of neck pain though not quite as long.  Her neck pain is not as severe but does cause tingling in her left arm and office has symptoms that goes down her back and underneath her scapula.  Her low back pain is particularly bothersome to her.  She is seen physicians for her back and in the past.  No loss of bowel or bladder control.  She has had a work-up by rheumatology for any autoimmune disorder which was negative.  She has been seen by Dr. Junius Roads.  She has tried 2 courses of physical therapy she has also tried a home exercise program with no relief in her symptoms.  She is now having tingling into her legs its been going on about a year.  This also also causes numbness in her feet.  She feels the symptoms are progressing.  As she has had sets of x-rays tried conservative treatment including medication, to try physical therapy and has had a rheumatology work-up we recommend an MRI of her lumbar spine.  She will follow-up with [place] once this is completed On her right flank she also has a tender spot that coincides with a fibrous adipose nodule.  We could consider injection this with steroid if there is no findings that would suggest otherwise by MRI Follow-Up Instructions: No follow-ups on file.   Orders:  No orders of the defined types were placed in this encounter.  No orders of the defined types were placed in this encounter.     Procedures: No procedures performed   Clinical Data: No additional findings.   Subjective: Chief Complaint  Patient presents  with   Lower Back - Pain   Neck - Pain  Patient presents today for lower back and neck pain. She said that he has had pain for 10years. She has been experiencing tingling for a year now. She said that prolonged sitting causes numbness in her feet. She has been evaluated in the past with Dr[facility]Bassett at [facility], and also Dr[facility]Jones with [facility]. She last saw Dr.Hilts two years ago. No previous neck or back surgery.     Review of Systems  All other systems reviewed and are negative.   Objective: Vital Signs: LMP 01/07/2020 (Exact Date)   Physical Exam Constitutional:      Appearance: Normal appearance.  Eyes:     Extraocular Movements: Extraocular movements intact.  Skin:    General: Skin is warm and dry.  Neurological:     General: No focal deficit present.     Mental Status: She is alert and oriented to person, place, and time.  Psychiatric:        Mood and Affect: Mood normal.        Behavior: Behavior normal.    Ortho Exam Cervical spine exam she has good range of motion with flexion extension and turning side to side.  Strength is 5  out of 5 bilateral lower extremities.  Cannot reproduced her paresthesias in her left arm today.  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  This does radiate some tenderness.  She has 5 out of 5 strength with dorsiflexion plantarflexion of her ankles as well as extension flexion of her knees and abduction abduction of her hips and flexion of her hips.  She has no pain with internal and external rotation of her hips.  Deep tendon reflexes are equivalent and intact.  Negative straight leg raise. Specialty Comments:  No specialty comments available.  Imaging: No results found.   PMFS History: Patient Active Problem List   Diagnosis Date Noted   Mild intermittent asthma without complication 95/28/4132   Premature ovarian failure 05/11/2021   Hyperlipidemia with  target LDL less than 160 05/11/2021   Visit for screening mammogram 07/21/2020   Other fatigue 07/20/2020   ANA positive 07/20/2020   Encounter for general adult medical examination with abnormal findings 07/20/2020   Past Medical History:  Diagnosis Date   Allergy    dogs and cats   Anxiety    Asthma    Degenerative disc disease, lumbar    History of PCOS    STD (sexually transmitted disease) 1995   tx'd for gonorrhea   Urinary incontinence    Vitamin D deficiency     Family History  Problem Relation Age of Onset   Osteoporosis Mother    Hypertension Mother    Hyperlipidemia Mother    Hypertension Father    Hyperlipidemia Father    Arthritis Father    Heart disease Paternal Grandmother    Heart disease Paternal Grandfather     Past Surgical History:  Procedure Laterality Date   BREAST SURGERY     Reduction   finger nerve reconstruction Left 2013   left hand, 4th digit.   INDUCED ABORTION  2005   Social History   Occupational History   Not on file  Tobacco Use   Smoking status: Former    Years: 20.00    Types: Cigarettes   Smokeless tobacco: Never  Vaping Use   Vaping Use: Every day  Substance and Sexual Activity   Alcohol use: Yes    Comment: occ   Drug use: Not Currently    Types: Marijuana    Comment: occ.   Sexual activity: Yes    Partners: Male

## 2021-08-26 ENCOUNTER — Ambulatory Visit: Payer: BC Managed Care – PPO | Admitting: Orthopaedic Surgery

## 2021-09-09 ENCOUNTER — Ambulatory Visit
Admission: RE | Admit: 2021-09-09 | Discharge: 2021-09-09 | Disposition: A | Discharge status: Home / Self Care | Payer: BC Managed Care – PPO | Source: Ambulatory Visit | Attending: Obstetrics and Gynecology | Admitting: Obstetrics and Gynecology

## 2021-09-09 ENCOUNTER — Other Ambulatory Visit: Payer: Self-pay | Admitting: Obstetrics and Gynecology

## 2021-09-09 DIAGNOSIS — Z1231 Encounter for screening mammogram for malignant neoplasm of breast: Secondary | ICD-10-CM

## 2021-09-14 ENCOUNTER — Encounter: Payer: Self-pay | Admitting: *Deleted

## 2021-10-09 ENCOUNTER — Inpatient Hospital Stay: Admission: RE | Admit: 2021-10-09 | Payer: BC Managed Care – PPO | Source: Ambulatory Visit

## 2021-10-10 ENCOUNTER — Telehealth: Payer: Self-pay | Admitting: Orthopaedic Surgery

## 2021-10-10 NOTE — Telephone Encounter (Signed)
LMOM for p tto return call to sch MRI Lsp review with Dr. Durward Fortes, after 10/09/21 ?

## 2021-10-31 ENCOUNTER — Other Ambulatory Visit: Payer: Self-pay | Admitting: Obstetrics and Gynecology

## 2021-11-28 NOTE — Telephone Encounter (Signed)
error 

## 2022-04-19 ENCOUNTER — Ambulatory Visit (INDEPENDENT_AMBULATORY_CARE_PROVIDER_SITE_OTHER): Payer: BC Managed Care – PPO | Admitting: Orthopaedic Surgery

## 2022-04-19 ENCOUNTER — Encounter: Payer: Self-pay | Admitting: Orthopaedic Surgery

## 2022-04-19 ENCOUNTER — Ambulatory Visit (INDEPENDENT_AMBULATORY_CARE_PROVIDER_SITE_OTHER): Payer: BC Managed Care – PPO

## 2022-04-19 DIAGNOSIS — G8929 Other chronic pain: Secondary | ICD-10-CM | POA: Diagnosis not present

## 2022-04-19 DIAGNOSIS — M542 Cervicalgia: Secondary | ICD-10-CM

## 2022-04-19 DIAGNOSIS — M5440 Lumbago with sciatica, unspecified side: Secondary | ICD-10-CM

## 2022-04-19 DIAGNOSIS — M545 Low back pain, unspecified: Secondary | ICD-10-CM | POA: Insufficient documentation

## 2022-04-19 NOTE — Progress Notes (Signed)
Office Visit Note   Patient: Michelle Beltran           Date of Birth: 02-24-1976           MRN: [id number] Visit Date: 04/19/2022              Requested by: Janith Lima, MD [address] [place],  [place] [address] PCP: Janith Lima, MD   Assessment & Plan: Visit Diagnoses:  1. Cervicalgia   2. Acute right-sided low back pain with sciatica, sciatica laterality unspecified   3. Neck pain   4. Chronic bilateral low back pain without sciatica     Plan: 46 year old female with chronic pain in the cervical and lumbar spine.  X-rays today reveal some mild degenerative changes in the mid cervical spine and in the lower lumbar spine.  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  She has not had any significant pain to either upper or lower extremity lotion on occasion she has had some anterior right thigh pain.  She denies any bowel or bladder dysfunction.  She actually has good range of motion of her cervical spine with minimal discomfort and some referred pain to the interscapular region and occasionally to the right scapula.  No problems with either shoulder.  Straight leg raise is negative and she had minimal discomfort to the lower lumbar spine.  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  Long discussion regarding the findings and what she can do to help her self over time.  Have given her a set of exercises and would be happy to supplement this with physical therapy but I think exercises will really make a difference.  She certainly can continue with over the counter medicines.  She might also want to ergonomically change her workstation  Follow-Up Instructions: Return if symptoms worsen or fail to improve.   Orders:  Orders Placed This Encounter  Procedures   XR Lumbar Spine 2-3 Views   XR Cervical Spine 2 or 3 views   No orders of the defined types were placed  in this encounter.     Procedures: No procedures performed   Clinical Data: No additional findings.   Subjective: Chief Complaint  Patient presents with   Lower Back - Pain  Patient presents today for lower back pain. She said that her pain started a year ago in her lower back and two years ago in her upper back. She has pain that radiates into her right leg. Tightness in her neck. She does not take anything for pain. She has tried physical therapy in the past.   HPI  Review of Systems   Objective: Vital Signs: LMP 12/16/2019   Physical Exam Constitutional:      Appearance: She is well-developed.  Eyes:     Pupils: Pupils are equal, round, and reactive to light.  Pulmonary:     Effort: Pulmonary effort is normal.  Skin:    General: Skin is warm and dry.  Neurological:     Mental Status: She is alert and oriented to person, place, and time.  Psychiatric:        Behavior: Behavior normal.     Ortho Exam awake alert and oriented x3 comfortable sitting and in no acute distress.  Excellent range of motion of the cervical spine in flexion extension or rotation without any  discomfort or referred pain to either upper extremity but there was some mild discomfort referred to the interscapular region.  She had several areas of trigger point tenderness about the right scapula but probably only 5 and number.  Painless range of motion of both shoulders.  Good grip and release.  Neurologically intact.  Straight leg raise negative bilaterally.  Painless range of motion of both hips.  No percussible tenderness lower lumbar spine.  No pain over either SI joint or over the lateral aspect of either hip.  No knee pain.  Neurologically intact.  Walk without a limp.  Specialty Comments:  No specialty comments available.  Imaging: XR Lumbar Spine 2-3 Views  Result Date: 04/19/2022 Films of the lumbar spine obtained in 2 projections.  There appear to be 6 nonrib-bearing bearing lumbar  vertebrae.  At L5-6 and L6-S1 there are some degenerative changes with narrowing of the disc space but no listhesis.  No scoliosis.  No acute changes  XR Cervical Spine 2 or 3 views  Result Date: 04/19/2022 Films of the cervical spine obtained in 2 projections.  There is minimal straightening of the normal cervical lordosis.  Some degenerative changes at C4-5 with anterior spurring at both C4 and C5.  Minimal facet sclerosis.  No scoliosis or listhesis    PMFS History: Patient Active Problem List   Diagnosis Date Noted   Neck pain 04/19/2022   Low back pain 04/19/2022   Mild intermittent asthma without complication 19/62/2297   Premature ovarian failure 05/11/2021   Hyperlipidemia with target LDL less than 160 05/11/2021   Visit for screening mammogram 07/21/2020   Other fatigue 07/20/2020   ANA positive 07/20/2020   Encounter for general adult medical examination with abnormal findings 07/20/2020   Past Medical History:  Diagnosis Date   Allergy    dogs and cats   Anxiety    Asthma    Degenerative disc disease, lumbar    History of PCOS    STD (sexually transmitted disease) 1995   tx'd for gonorrhea   Urinary incontinence    Vitamin D deficiency     Family History  Problem Relation Age of Onset   Osteoporosis Mother    Hypertension Mother    Hyperlipidemia Mother    Hypertension Father    Hyperlipidemia Father    Arthritis Father    Heart disease Paternal Grandmother    Heart disease Paternal Grandfather     Past Surgical History:  Procedure Laterality Date   BREAST SURGERY     Reduction   finger nerve reconstruction Left 2013   left hand, 4th digit.   INDUCED ABORTION  2005   Social History   Occupational History   Not on file  Tobacco Use   Smoking status: Former    Years: 20.00    Types: Cigarettes   Smokeless tobacco: Never  Vaping Use   Vaping Use: Every day  Substance and Sexual Activity   Alcohol use: Yes    Comment: occ   Drug use: Not  Currently    Types: Marijuana    Comment: occ.   Sexual activity: Yes    Partners: Male

## 2022-05-08 ENCOUNTER — Ambulatory Visit: Payer: BC Managed Care – PPO | Admitting: Internal Medicine

## 2022-05-13 IMAGING — MG MM DIGITAL SCREENING BILAT W/ TOMO AND CAD
8 series · 8 of 24 positions shown · non-contrast
Comparison: Previous exam(s).

CLINICAL DATA: Screening.

EXAM:
DIGITAL SCREENING BILATERAL MAMMOGRAM WITH TOMOSYNTHESIS AND CAD
TECHNIQUE: Bilateral screening digital craniocaudal and mediolateral oblique
mammograms were obtained. Bilateral screening digital breast
tomosynthesis was performed. The images were evaluated with
computer-aided detection.

[L MLO synth-2D]
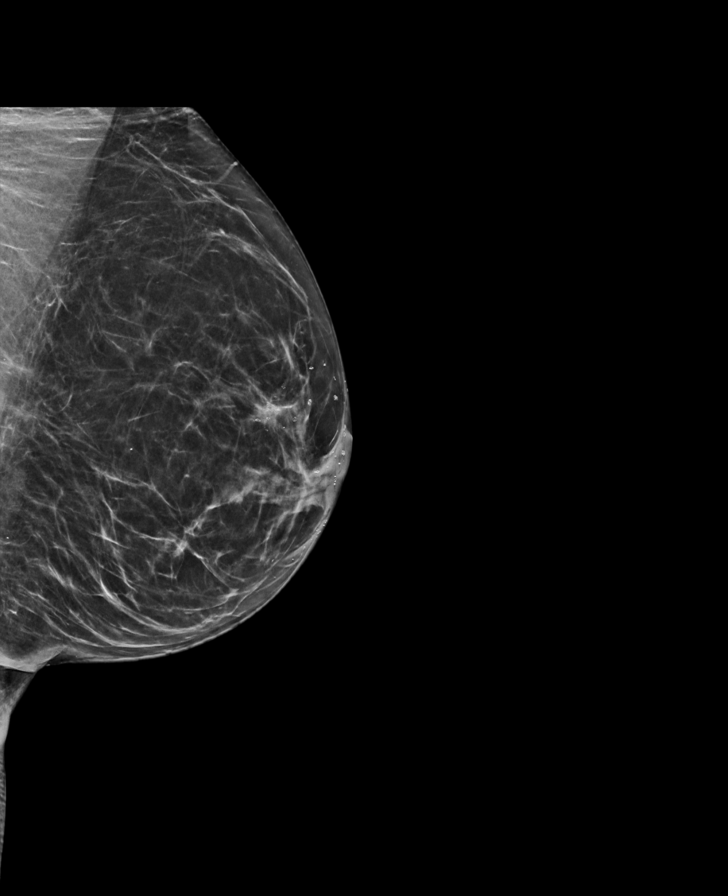

[L CC synth-2D]
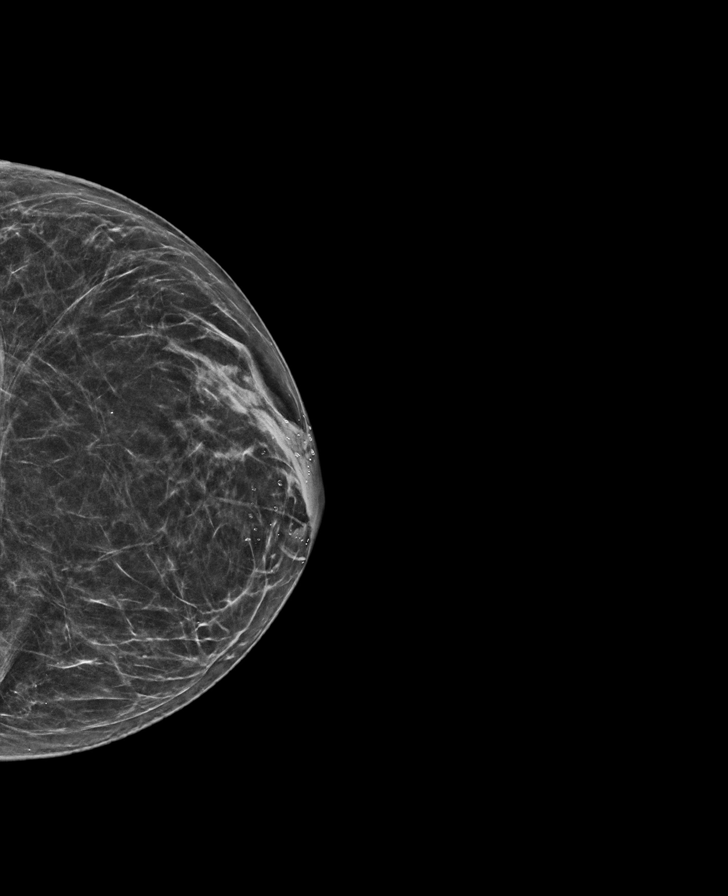

[R CC synth-2D]
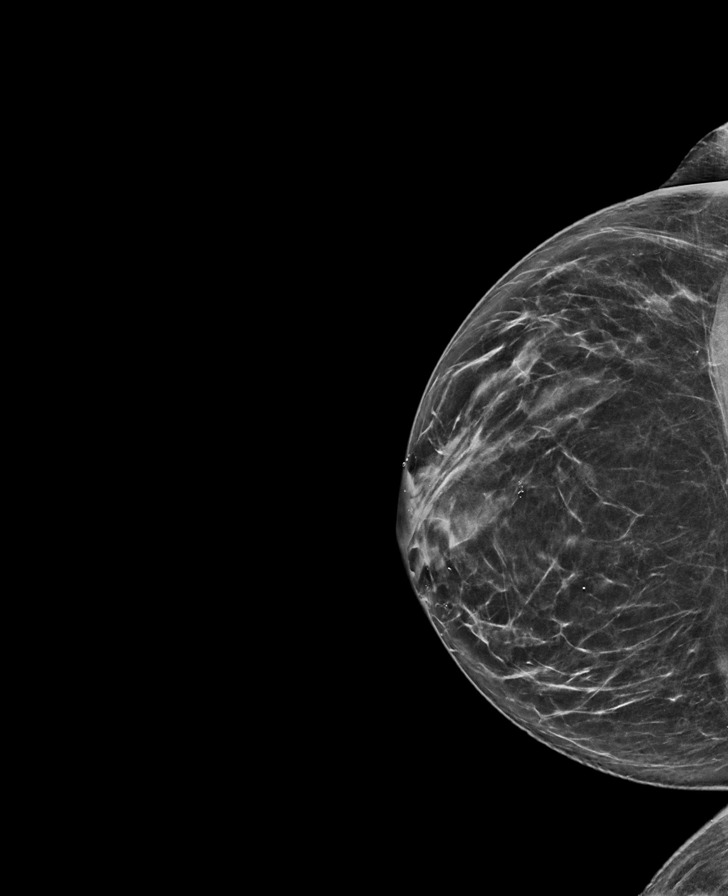

[R MLO synth-2D]
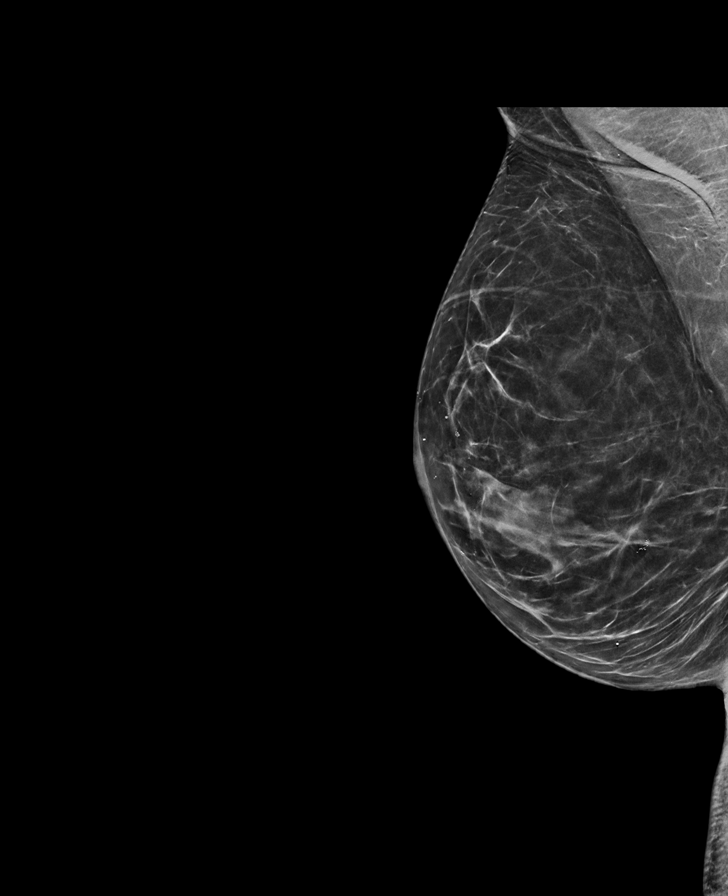

[R MLO tomo · tomo slice 33/65.0]
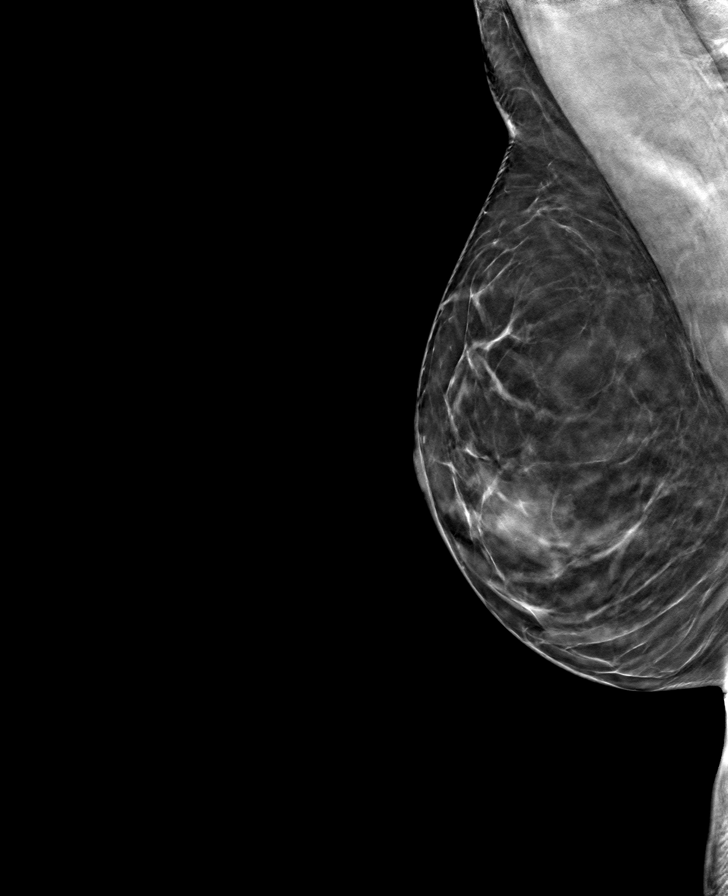

[R CC tomo · tomo slice 32/63.0]
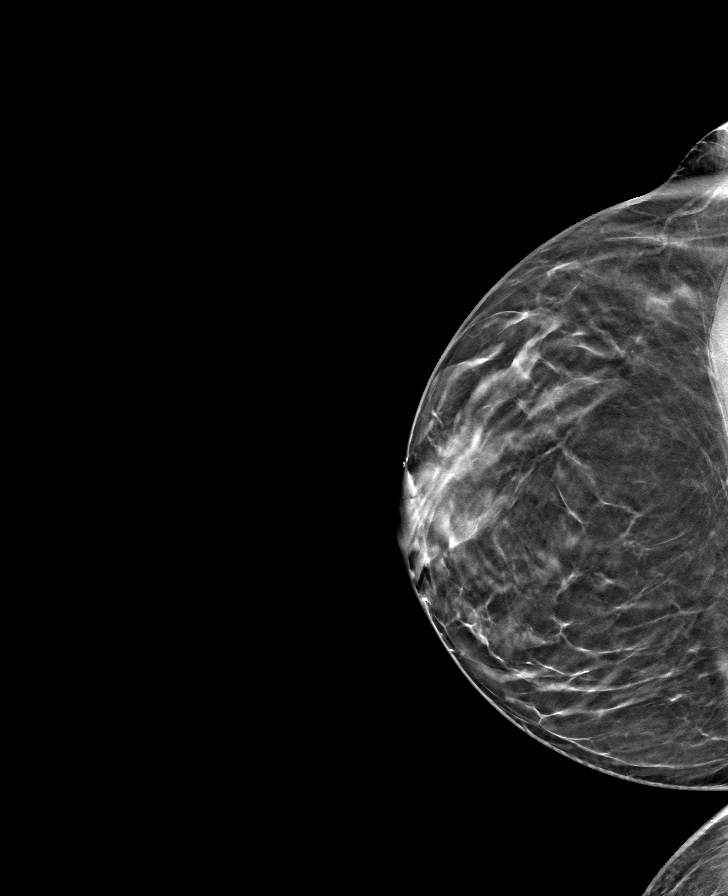

[L MLO tomo · tomo slice 31/61.0]
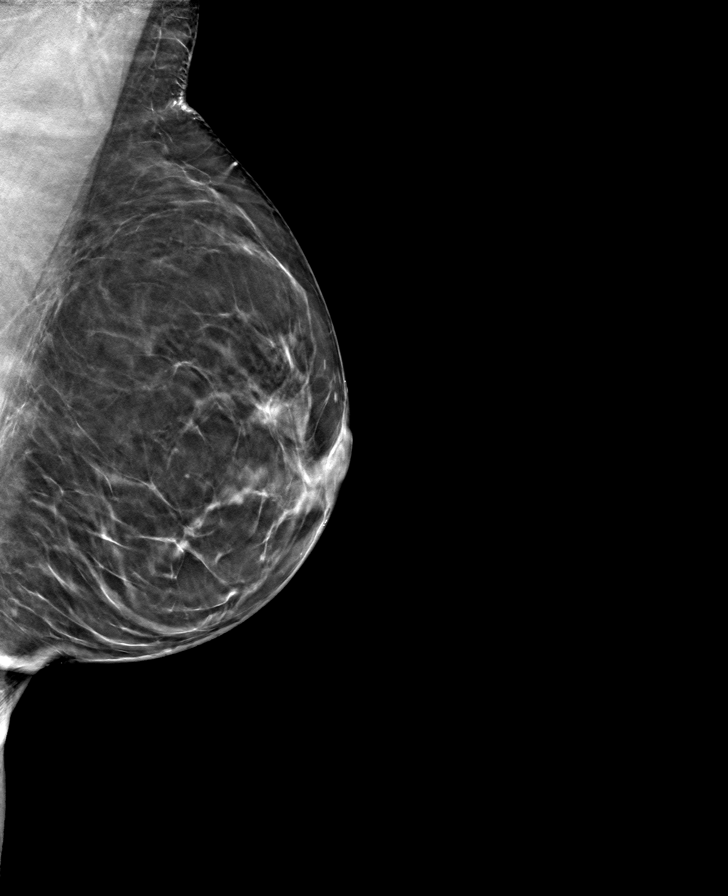

[L CC tomo · tomo slice 29/58.0]
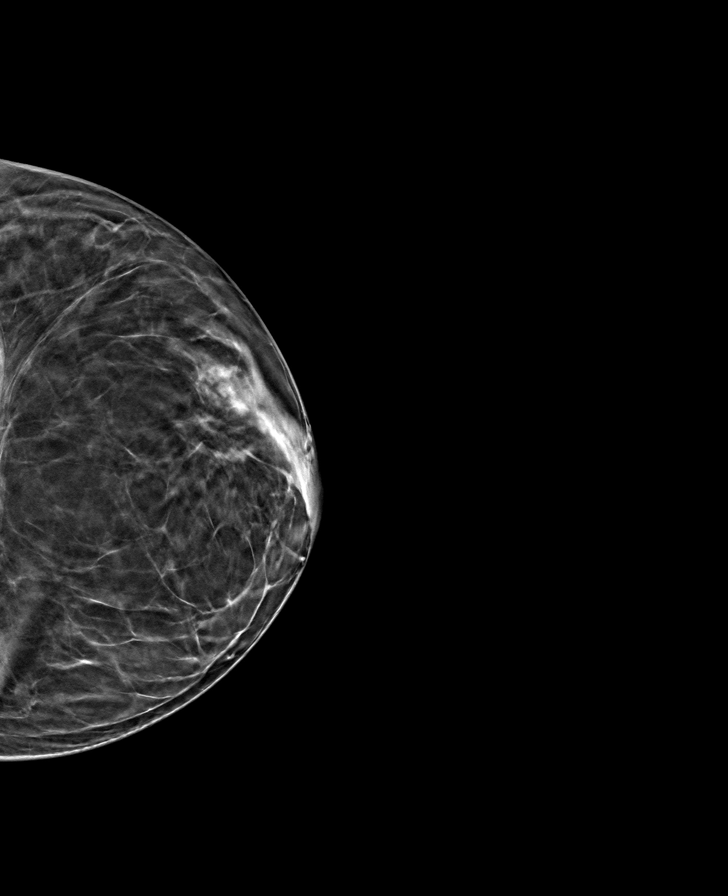

[8 of 24 positions shown; findings below may reference images not displayed]

ACR Breast Density Category c: The breast tissue is heterogeneously
dense, which may obscure small masses.
FINDINGS: There are no findings suspicious for malignancy.
IMPRESSION: No mammographic evidence of malignancy. A result letter of this
screening mammogram will be mailed directly to the patient.

RECOMMENDATION:
Screening mammogram in one year. (Code:Q3-W-BC3)

BI-RADS CATEGORY  1: Negative.

## 2022-05-31 ENCOUNTER — Ambulatory Visit: Payer: BC Managed Care – PPO | Admitting: Orthopaedic Surgery

## 2022-06-01 ENCOUNTER — Encounter: Payer: Self-pay | Admitting: Orthopaedic Surgery

## 2022-06-01 ENCOUNTER — Ambulatory Visit: Payer: BC Managed Care – PPO | Admitting: Orthopaedic Surgery

## 2022-06-01 DIAGNOSIS — M542 Cervicalgia: Secondary | ICD-10-CM

## 2022-06-01 MED ORDER — METHYLPREDNISOLONE 4 MG PO TBPK: ORAL_TABLET | ORAL | 0 refills | Status: DC

## 2022-06-01 NOTE — Progress Notes (Signed)
Office Visit Note   Patient: Michelle Beltran           Date of Birth: January 23, 1976           MRN: [id number] Visit Date: 06/01/2022              Requested by: Janith Lima, MD [address] [place],   [address] PCP: Janith Lima, MD   Assessment & Plan: Visit Diagnoses:  1. Neck pain     Plan: Michelle Beltran is a pleasant 46 year old woman who was last seen approximately 6 weeks ago for low back pain and cervical neck pain.  She did have x-rays of her neck at that time that demonstrated some degenerative changes and slight straightening of the normal lordotic curve with some facet arthropathy.  She was provided exercises to do for her lower and cervical spine.  She reports these were very helpful.  She also modified her workspace and elevated her computer so she was not bending over as much.  She was doing well until last week when she was painting a bathroom ceiling.  She was up on a ladder.  The next day she noticed that she had pain on the right side of her neck.  Denies any weakness she thought maybe she had some slight tingling in her little finger but this spontaneously resolved.  She started some Robaxin yesterday and does think that is been helpful.  Her exam was fairly benign.  She does have good motion and the motion does not reproduce any radicular findings.  She does get some pain in her neck especially with turning her head to the right.  Discussed that she probably just reaggravated her symptoms.  She does not have to limit her activities except if they are painful.  We also discussed trying a Depo-Medrol Dosepak.  She would like to go forward with this will call it in for her.  Advised her to take it with food and not to take it with other anti-inflammatories.  Will contact us if she does not improve in the next 10 to 14 days.  Follow-Up Instructions: Return if symptoms worsen or fail to improve.   Orders:  No orders of the defined types were placed in this  encounter.  Meds ordered this encounter  Medications   methylPREDNISolone (MEDROL DOSEPAK) 4 MG TBPK tablet    Sig: Take as directed with food    Dispense:  21 tablet    Refill:  0      Procedures: No procedures performed   Clinical Data: No additional findings.   Subjective: Chief Complaint  Patient presents with   Neck - Follow-up  Patient presents today for a 6 week follow up on her neck pain. She was doing well until she painted a ceiling 2 weeks ago. She then was unable to rotate her head to the right. She has been taking Robax from [place] and states that it has really helped. She has gotten an occasional bit of numbness and tingling in her fingers. She feels like she has developed a knot in her neck that she can feel.    Review of Systems  All other systems reviewed and are negative.    Objective: Vital Signs: LMP 12/16/2019   Physical Exam Constitutional:      Appearance: Normal appearance.  Pulmonary:     Effort: Pulmonary effort is normal.  Skin:    General: Skin is warm and dry.  Neurological:  General: No focal deficit present.     Mental Status: She is alert.  Psychiatric:        Mood and Affect: Mood normal.    Ortho Exam Patient appears well sitting on the exam table.  She has good motion of her neck with forward flexion extension side to side turning maybe a little stiffness turning to the right which recreates some of her pain at the base of her neck.  Strength is 5 out of 5.  Sensation is intact.  There is no reproduction of radicular symptoms with range of motion.  No shoulder pain. Specialty Comments:  No specialty comments available.  Imaging: No results found.   PMFS History: Patient Active Problem List   Diagnosis Date Noted   Neck pain 04/19/2022   Low back pain 04/19/2022   Mild intermittent asthma without complication 24/40/1027   Premature ovarian failure 05/11/2021   Hyperlipidemia with target LDL less than 160 05/11/2021    Visit for screening mammogram 07/21/2020   Other fatigue 07/20/2020   ANA positive 07/20/2020   Encounter for general adult medical examination with abnormal findings 07/20/2020   Past Medical History:  Diagnosis Date   Allergy    dogs and cats   Anxiety    Asthma    Degenerative disc disease, lumbar    History of PCOS    STD (sexually transmitted disease) 1995   tx'd for gonorrhea   Urinary incontinence    Vitamin D deficiency     Family History  Problem Relation Age of Onset   Osteoporosis Mother    Hypertension Mother    Hyperlipidemia Mother    Hypertension Father    Hyperlipidemia Father    Arthritis Father    Heart disease Paternal Grandmother    Heart disease Paternal Grandfather     Past Surgical History:  Procedure Laterality Date   BREAST SURGERY     Reduction   finger nerve reconstruction Left 2013   left hand, 4th digit.   INDUCED ABORTION  2005   Social History   Occupational History   Not on file  Tobacco Use   Smoking status: Former    Years: 20.00    Types: Cigarettes   Smokeless tobacco: Never  Vaping Use   Vaping Use: Every day  Substance and Sexual Activity   Alcohol use: Yes    Comment: occ   Drug use: Not Currently    Types: Marijuana    Comment: occ.   Sexual activity: Yes    Partners: Male

## 2022-06-11 ENCOUNTER — Other Ambulatory Visit: Payer: Self-pay | Admitting: Obstetrics and Gynecology

## 2022-06-12 NOTE — Progress Notes (Unsigned)
46 y.o. [id number] Married [place] female here for annual exam.    Using HRT.  She wants to continue.  No bleeding.  She does notice emotional change when it is time to change the patch.  She wants to continue the current dosage.  Her Baden was 60 on 05/11/21.  Asking about Kegels.  PCP:   Scarlette Calico, MD  Patient's last menstrual period was 12/16/2019.           Sexually active: Yes.    The current method of family planning is post menopausal status.    Exercising: Yes. Core workouts. Smoker:  no, former  Health Maintenance: Pap:  2019 normal w/[facility], 04-17-16 Neg:Neg HR HPV  History of abnormal Pap:  no MMG:  09/09/21, Breast Density Category B, BI-RADS CATEGORY 1: Negative Colonoscopy:  05/21/20 BMD:   n/a  Result  n/a TDaP:  06/13/22 Gardasil:   no Screening Labs:  PCP yesterday.  Flu vaccine:  discussed.  Covid:  completed.   reports that she has quit smoking. Her smoking use included cigarettes. She has never used smokeless tobacco. She reports current alcohol use. She reports that she does not currently use drugs after having used the following drugs: Marijuana.  Past Medical History:  Diagnosis Date   Allergy    dogs and cats   Anxiety    Asthma    Degenerative disc disease, lumbar    History of PCOS    STD (sexually transmitted disease) 1995   tx'd for gonorrhea   Urinary incontinence    Vitamin D deficiency     Past Surgical History:  Procedure Laterality Date   BREAST SURGERY     Reduction   finger nerve reconstruction Left 2013   left hand, 4th digit.   INDUCED ABORTION  2005    Current Outpatient Medications  Medication Sig Dispense Refill   albuterol (VENTOLIN HFA) 108 (90 Base) MCG/ACT inhaler Inhale 2 puffs into the lungs every 4 (four) hours as needed for wheezing. 1 each 5   CALCIUM PO Take by mouth daily.     Cholecalciferol (VITAMIN D3) 125 MCG (5000 UT) CAPS Take by mouth. Take 5000 units 5 days weekly.     estradiol  (VIVELLE-DOT) 0.05 MG/24HR patch APPLY 1 PATCH(0.05 MG) TOPICALLY TO THE SKIN 2 TIMES A WEEK 24 patch 3   Multiple Vitamins-Minerals (ZINC PO) Take 1 tablet by mouth daily.     progesterone (PROMETRIUM) 100 MG capsule Take 1 capsule (100 mg total) by mouth daily. 90 capsule 3   No current facility-administered medications for this visit.    Family History  Problem Relation Age of Onset   Osteoporosis Mother    Hypertension Mother    Hyperlipidemia Mother    Hypertension Father    Hyperlipidemia Father    Arthritis Father    Heart disease Paternal Grandmother    Heart disease Paternal Grandfather     Review of Systems  All other systems reviewed and are negative.   Exam:   BP 101/65 (BP Location: Right Arm, Patient Position: Sitting, Cuff Size: Normal)   Ht '5\' 7"'$  (1.702 m)   Wt 155 lb (70.3 kg)   LMP 12/16/2019   BMI 24.28 kg/m     General appearance: alert, cooperative and appears stated age Head: normocephalic, without obvious abnormality, atraumatic Neck: no adenopathy, supple, symmetrical, trachea midline and thyroid normal to inspection and palpation Lungs: clear to auscultation bilaterally Breasts: consistent with bilateral reduction, no masses or tenderness, No nipple retraction  or dimpling, No nipple discharge or bleeding, No axillary adenopathy Heart: regular rate and rhythm Abdomen: soft, non-tender; no masses, no organomegaly Extremities: extremities normal, atraumatic, no cyanosis or edema Skin: skin color, texture, turgor normal. No rashes or lesions Lymph nodes: cervical, supraclavicular, and axillary nodes normal. Neurologic: grossly normal  Pelvic: External genitalia:  no lesions              No abnormal inguinal nodes palpated.              Urethra:  normal appearing urethra with no masses, tenderness or lesions              Bartholins and Skenes: normal                 Vagina: normal appearing vagina with normal color and discharge, no lesions               Cervix: no lesions              Pap taken: no Bimanual Exam:  Uterus:  normal size, contour, position, consistency, mobility, non-tender              Adnexa: no mass, fullness, tenderness              Rectal exam: yes.  Confirms.              Anus:  normal sphincter tone, no lesions  Chaperone was present for exam:  Raquel Sarna  Assessment:   Well woman visit with gynecologic exam. Status post bilateral breast reduction.  HRT.  Plan: Mammogram screening discussed. Self breast awareness reviewed. Pap and HR HPV as above. Guidelines for Calcium, Vitamin D, regular exercise program including cardiovascular and weight bearing exercise. Discused WHI and use of HRT which can increase risk of PE, DVT, MI, stroke and breast cancer.  Refill of Prometrium and Vivelle Dot for one year.   Follow up annually and prn.   After visit summary provided.

## 2022-06-13 ENCOUNTER — Encounter: Payer: Self-pay | Admitting: Internal Medicine

## 2022-06-13 ENCOUNTER — Ambulatory Visit: Payer: BC Managed Care – PPO | Admitting: Internal Medicine

## 2022-06-13 VITALS — BP 104/68 | HR 63 | Temp 98.7°F | Ht 67.0 in | Wt 158.0 lb

## 2022-06-13 DIAGNOSIS — R197 Diarrhea, unspecified: Secondary | ICD-10-CM

## 2022-06-13 DIAGNOSIS — Z0001 Encounter for general adult medical examination with abnormal findings: Secondary | ICD-10-CM | POA: Diagnosis not present

## 2022-06-13 DIAGNOSIS — J4520 Mild intermittent asthma, uncomplicated: Secondary | ICD-10-CM | POA: Diagnosis not present

## 2022-06-13 DIAGNOSIS — E785 Hyperlipidemia, unspecified: Secondary | ICD-10-CM | POA: Diagnosis not present

## 2022-06-13 DIAGNOSIS — R768 Other specified abnormal immunological findings in serum: Secondary | ICD-10-CM | POA: Diagnosis not present

## 2022-06-13 DIAGNOSIS — Z23 Encounter for immunization: Secondary | ICD-10-CM

## 2022-06-13 DIAGNOSIS — Z1159 Encounter for screening for other viral diseases: Secondary | ICD-10-CM

## 2022-06-13 LAB — CBC WITH DIFFERENTIAL/PLATELET
Basophils Absolute: 0.1 10*3/uL (ref 0.0–0.1)
Basophils Relative: 0.8 % (ref 0.0–3.0)
Eosinophils Absolute: 0.2 10*3/uL (ref 0.0–0.7)
Eosinophils Relative: 2.3 % (ref 0.0–5.0)
HCT: 41.3 % (ref 36.0–46.0)
Hemoglobin: 13.8 g/dL (ref 12.0–15.0)
Lymphocytes Relative: 45.8 % (ref 12.0–46.0)
Lymphs Abs: 3.3 10*3/uL (ref 0.7–4.0)
MCHC: 33.4 g/dL (ref 30.0–36.0)
MCV: 98.6 fl (ref 78.0–100.0)
Monocytes Absolute: 0.4 10*3/uL (ref 0.1–1.0)
Monocytes Relative: 5 % (ref 3.0–12.0)
Neutro Abs: 3.3 10*3/uL (ref 1.4–7.7)
Neutrophils Relative %: 46.1 % (ref 43.0–77.0)
Platelets: 199 10*3/uL (ref 150.0–400.0)
RBC: 4.19 Mil/uL (ref 3.87–5.11)
RDW: 13.8 % (ref 11.5–15.5)
WBC: 7.2 10*3/uL (ref 4.0–10.5)

## 2022-06-13 LAB — BASIC METABOLIC PANEL
BUN: 9 mg/dL (ref 6–23)
CO2: 29 mEq/L (ref 19–32)
Calcium: 9.4 mg/dL (ref 8.4–10.5)
Chloride: 102 mEq/L (ref 96–112)
Creatinine, Ser: 0.55 mg/dL (ref 0.40–1.20)
GFR: 108.45 mL/min (ref >60.00)
Glucose, Bld: 92 mg/dL (ref 70–99)
Potassium: 4.1 mEq/L (ref 3.5–5.1)
Sodium: 137 mEq/L (ref 135–145)

## 2022-06-13 LAB — HEPATIC FUNCTION PANEL
ALT: 14 U/L (ref 0–35)
AST: 18 U/L (ref 0–37)
Albumin: 4.6 g/dL (ref 3.5–5.2)
Alkaline Phosphatase: 54 U/L (ref 39–117)
Bilirubin, Direct: 0.1 mg/dL (ref 0.0–0.3)
Total Bilirubin: 0.7 mg/dL (ref 0.2–1.2)
Total Protein: 7.6 g/dL (ref 6.0–8.3)

## 2022-06-13 LAB — LIPID PANEL
Cholesterol: 232 mg/dL — ABNORMAL HIGH (ref 0–200)
HDL: 89.7 mg/dL (ref >39.00)
LDL Cholesterol: 127 mg/dL — ABNORMAL HIGH (ref 0–99)
NonHDL: 142.75
Total CHOL/HDL Ratio: 3
Triglycerides: 80 mg/dL (ref 0.0–149.0)
VLDL: 16 mg/dL (ref 0.0–40.0)

## 2022-06-13 LAB — TSH: TSH: 0.93 u[IU]/mL (ref 0.35–5.50)

## 2022-06-13 LAB — C-REACTIVE PROTEIN: CRP: <1 mg/dL (ref 0.5–20.0)

## 2022-06-13 NOTE — Progress Notes (Signed)
Subjective:  Patient ID: Michelle Beltran, female    DOB: [birth year]  Age: 46 y.o. MRN: [id number]  CC: Annual Exam, Asthma, and Hyperlipidemia   HPI Michelle Beltran presents for a CPX and f/up -  She complains of chronic, intermittent non-radiating neck pain with tingling in both hands. She recently saw an orthopedist and has completed a course steroids with symptom improvement.  She complains of intermittent diarrhea that exacerbated by hot sauce.  She also has intermittent constipation.  She denies abdominal pain, loss of appetite, weight loss, or bright red blood per rectum.  Outpatient Medications Prior to Visit  Medication Sig Dispense Refill   albuterol (VENTOLIN HFA) 108 (90 Base) MCG/ACT inhaler Inhale 2 puffs into the lungs every 4 (four) hours as needed for wheezing. 1 each 5   CALCIUM PO Take by mouth daily.     Cholecalciferol (VITAMIN D3) 125 MCG (5000 UT) CAPS Take by mouth. Take 5000 units 5 days weekly.     Multiple Vitamins-Minerals (ZINC PO) Take 1 tablet by mouth daily.     estradiol (VIVELLE-DOT) 0.05 MG/24HR patch APPLY 1 PATCH(0.05 MG) TOPICALLY TO THE SKIN 2 TIMES A WEEK 24 patch 3   methylPREDNISolone (MEDROL DOSEPAK) 4 MG TBPK tablet Take as directed with food 21 tablet 0   progesterone (PROMETRIUM) 100 MG capsule Take 1 capsule (100 mg total) by mouth daily. 90 capsule 3   fexofenadine (ALLEGRA) 180 MG tablet Take 180 mg by mouth daily as needed.      No facility-administered medications prior to visit.    ROS Review of Systems  Constitutional: Negative.  Negative for diaphoresis and fatigue.  HENT: Negative.    Eyes: Negative.   Respiratory: Negative.  Negative for cough, shortness of breath and wheezing.   Cardiovascular:  Negative for chest pain, palpitations and leg swelling.  Gastrointestinal:  Positive for diarrhea. Negative for abdominal pain, blood in stool, constipation, nausea and vomiting.  Endocrine: Negative.    Genitourinary: Negative.  Negative for difficulty urinating.  Musculoskeletal:  Positive for neck pain. Negative for arthralgias, joint swelling and myalgias.  Skin: Negative.  Negative for color change and pallor.  Allergic/Immunologic: Negative.   Neurological: Negative.  Negative for dizziness, weakness and numbness.  Hematological:  Negative for adenopathy. Does not bruise/bleed easily.  Psychiatric/Behavioral: Negative.      Objective:  BP 104/68 (BP Location: Left Arm, Patient Position: Sitting, Cuff Size: Large)   Pulse 63   Temp 98.7 F (37.1 C) (Oral)   Ht '5\' 7"'$  (1.702 m)   Wt 158 lb (71.7 kg)   LMP 12/16/2019   SpO2 97%   BMI 24.75 kg/m   BP Readings from Last 3 Encounters:  06/14/22 101/65  06/13/22 104/68  08/10/21 112/62    Wt Readings from Last 3 Encounters:  06/14/22 155 lb (70.3 kg)  06/13/22 158 lb (71.7 kg)  08/17/21 155 lb (70.3 kg)    Physical Exam Vitals reviewed.  Constitutional:      Appearance: Normal appearance.  HENT:     Mouth/Throat:     Mouth: Mucous membranes are moist.  Eyes:     General: No scleral icterus.    Conjunctiva/sclera: Conjunctivae normal.  Cardiovascular:     Rate and Rhythm: Normal rate and regular rhythm.     Pulses: Normal pulses.     Heart sounds: No murmur heard. Pulmonary:     Effort: Pulmonary effort is normal.     Breath sounds: No stridor. No  wheezing, rhonchi or rales.  Abdominal:     General: Abdomen is flat.     Palpations: There is no mass.     Tenderness: There is no abdominal tenderness. There is no guarding.     Hernia: No hernia is present.  Musculoskeletal:        General: Normal range of motion.     Cervical back: Neck supple.     Right lower leg: No edema.     Left lower leg: No edema.  Lymphadenopathy:     Cervical: No cervical adenopathy.  Skin:    General: Skin is warm and dry.  Neurological:     General: No focal deficit present.     Mental Status: She is alert.  Psychiatric:         Mood and Affect: Mood normal.        Behavior: Behavior normal.     Lab Results  Component Value Date   WBC 7.2 06/13/2022   HGB 13.8 06/13/2022   HCT 41.3 06/13/2022   PLT 199.0 06/13/2022   GLUCOSE 92 06/13/2022   CHOL 232 (H) 06/13/2022   TRIG 80.0 06/13/2022   HDL 89.70 06/13/2022   LDLCALC 127 (H) 06/13/2022   ALT 14 06/13/2022   AST 18 06/13/2022   NA 137 06/13/2022   K 4.1 06/13/2022   CL 102 06/13/2022   CREATININE 0.55 06/13/2022   BUN 9 06/13/2022   CO2 29 06/13/2022   TSH 0.93 06/13/2022    MM DIGITAL SCREENING BILATERAL  Result Date: 09/09/2021 CLINICAL DATA:  Screening. EXAM: DIGITAL SCREENING BILATERAL MAMMOGRAM WITH CAD TECHNIQUE: Bilateral screening digital craniocaudal and mediolateral oblique mammograms were obtained. The images were evaluated with computer-aided detection. COMPARISON:  Previous exam(s). ACR Breast Density Category b: There are scattered areas of fibroglandular density. FINDINGS: There are no findings suspicious for malignancy. IMPRESSION: No mammographic evidence of malignancy. A result letter of this screening mammogram will be mailed directly to the patient. RECOMMENDATION: Screening mammogram in one year. (Code:[id number]-01Y) BI-RADS CATEGORY  1: Negative. Electronically Signed   By: Ammie Ferrier M.D.   On: 09/09/2021 14:03    Assessment & Plan:   Michelle Beltran was seen today for annual exam, asthma and hyperlipidemia.  Diagnoses and all orders for this visit:  ANA positive- Her ANA is negative now. -     ANA; Future -     Basic metabolic panel; Future -     C-reactive protein; Future -     Hepatic function panel; Future -     Cyclic citrul peptide antibody, IgG; Future -     Cyclic citrul peptide antibody, IgG -     Hepatic function panel -     C-reactive protein -     Basic metabolic panel -     ANA  Hyperlipidemia with target LDL less than 160- Statin is not indicated. -     Lipid panel; Future -     Basic metabolic panel;  Future -     TSH; Future -     Hepatic function panel; Future -     Hepatic function panel -     TSH -     Basic metabolic panel -     Lipid panel  Encounter for general adult medical examination with abnormal findings- Exam completed, labs reviewed, vaccines reviewed - She refused vaccines against influenza and pneumonia, cancer screenings are up-to-date, patient education was given. -     Hepatitis C antibody; Future -  HIV Antibody (routine testing w rflx); Future -     HIV Antibody (routine testing w rflx) -     Hepatitis C antibody  Mild intermittent asthma without complication -     Basic metabolic panel; Future -     CBC with Differential/Platelet; Future -     Hepatic function panel; Future -     Hepatic function panel -     CBC with Differential/Platelet -     Basic metabolic panel  Diarrhea, unspecified type- Testing for celiac disease is negative.  Labs are reassuring. -     Basic metabolic panel; Future -     CBC with Differential/Platelet; Future -     TSH; Future -     C-reactive protein; Future -     Tissue Transglutaminase Abs,IgG,IgA; Future -     Hepatic function panel; Future -     Hepatic function panel -     Tissue Transglutaminase Abs,IgG,IgA -     C-reactive protein -     TSH -     CBC with Differential/Platelet -     Basic metabolic panel  Need for hepatitis C screening test -     Hepatitis C antibody; Future -     Hepatitis C antibody  Other orders -     Tdap vaccine greater than or equal to 7yo IM   I have discontinued Michelle Beltran's fexofenadine and methylPREDNISolone. I am also having her maintain her Vitamin D3, CALCIUM PO, albuterol, and Multiple Vitamins-Minerals (ZINC PO).  No orders of the defined types were placed in this encounter.    Follow-up: Return in about 6 months (around 12/12/2022).  Scarlette Calico, MD

## 2022-06-13 NOTE — Patient Instructions (Signed)

## 2022-06-14 ENCOUNTER — Encounter: Payer: Self-pay | Admitting: Obstetrics and Gynecology

## 2022-06-14 ENCOUNTER — Ambulatory Visit (INDEPENDENT_AMBULATORY_CARE_PROVIDER_SITE_OTHER): Payer: BC Managed Care – PPO | Admitting: Obstetrics and Gynecology

## 2022-06-14 VITALS — BP 101/65 | Ht 67.0 in | Wt 155.0 lb

## 2022-06-14 DIAGNOSIS — Z01419 Encounter for gynecological examination (general) (routine) without abnormal findings: Secondary | ICD-10-CM

## 2022-06-14 MED ORDER — PROGESTERONE MICRONIZED 100 MG PO CAPS: 100.0000 mg | ORAL_CAPSULE | Freq: Every day | ORAL | 3 refills | Status: DC

## 2022-06-14 MED ORDER — ESTRADIOL 0.05 MG/24HR TD PTTW: MEDICATED_PATCH | TRANSDERMAL | 3 refills | Status: DC

## 2022-06-14 NOTE — Patient Instructions (Signed)

## 2022-06-16 ENCOUNTER — Telehealth: Payer: Self-pay | Admitting: Orthopaedic Surgery

## 2022-06-16 DIAGNOSIS — M542 Cervicalgia: Secondary | ICD-10-CM

## 2022-06-16 LAB — HEPATITIS C ANTIBODY: Hepatitis C Ab: NONREACTIVE

## 2022-06-16 LAB — TISSUE TRANSGLUTAMINASE ABS,IGG,IGA
(tTG) Ab, IgA: <1 U/mL
(tTG) Ab, IgG: <1 U/mL

## 2022-06-16 LAB — CYCLIC CITRUL PEPTIDE ANTIBODY, IGG: Cyclic Citrullin Peptide Ab: <16 UNITS

## 2022-06-16 LAB — ANA: Anti Nuclear Antibody (ANA): NEGATIVE

## 2022-06-16 LAB — HIV ANTIBODY (ROUTINE TESTING W REFLEX): HIV 1&2 Ab, 4th Generation: NONREACTIVE

## 2022-06-16 NOTE — Telephone Encounter (Signed)
Pt called requesting a work note to reduce hours due to her neck pains. Pt is asking for it to be sent to her mychart. Pt also is asking for a referral for physical therapy. Please call pt at [phone]

## 2022-06-19 NOTE — Telephone Encounter (Signed)
Michelle Beltran patient

## 2022-06-20 NOTE — Telephone Encounter (Signed)
PT order placed in chart

## 2022-06-20 NOTE — Telephone Encounter (Signed)
Lvm for pt to cb to advise on what she is wanting to reduce hours to.

## 2022-06-29 ENCOUNTER — Emergency Department (HOSPITAL_BASED_OUTPATIENT_CLINIC_OR_DEPARTMENT_OTHER): Payer: BC Managed Care – PPO | Admitting: Radiology

## 2022-06-29 ENCOUNTER — Other Ambulatory Visit: Payer: Self-pay

## 2022-06-29 ENCOUNTER — Emergency Department (HOSPITAL_BASED_OUTPATIENT_CLINIC_OR_DEPARTMENT_OTHER)
Admission: EM | Admit: 2022-06-29 | Discharge: 2022-06-29 | Disposition: A | Discharge status: Home / Self Care | Payer: BC Managed Care – PPO | Attending: Emergency Medicine | Admitting: Emergency Medicine

## 2022-06-29 ENCOUNTER — Encounter (HOSPITAL_BASED_OUTPATIENT_CLINIC_OR_DEPARTMENT_OTHER): Payer: Self-pay

## 2022-06-29 DIAGNOSIS — R253 Fasciculation: Secondary | ICD-10-CM | POA: Diagnosis not present

## 2022-06-29 DIAGNOSIS — R079 Chest pain, unspecified: Secondary | ICD-10-CM | POA: Insufficient documentation

## 2022-06-29 LAB — CBC
HCT: 39 % (ref 36.0–46.0)
Hemoglobin: 13.1 g/dL (ref 12.0–15.0)
MCH: 32.7 pg (ref 26.0–34.0)
MCHC: 33.6 g/dL (ref 30.0–36.0)
MCV: 97.3 fL (ref 80.0–100.0)
Platelets: 220 10*3/uL (ref 150–400)
RBC: 4.01 MIL/uL (ref 3.87–5.11)
RDW: 12.8 % (ref 11.5–15.5)
WBC: 6 10*3/uL (ref 4.0–10.5)
nRBC: 0 % (ref 0.0–0.2)

## 2022-06-29 LAB — BASIC METABOLIC PANEL
Anion gap: 10 (ref 5–15)
BUN: 11 mg/dL (ref 6–20)
CO2: 26 mmol/L (ref 22–32)
Calcium: 9.5 mg/dL (ref 8.9–10.3)
Chloride: 101 mmol/L (ref 98–111)
Creatinine, Ser: 0.58 mg/dL (ref 0.44–1.00)
GFR, Estimated: >60 mL/min (ref >60)
Glucose, Bld: 91 mg/dL (ref 70–99)
Potassium: 4.1 mmol/L (ref 3.5–5.1)
Sodium: 137 mmol/L (ref 135–145)

## 2022-06-29 LAB — TROPONIN I (HIGH SENSITIVITY): Troponin I (High Sensitivity): <2 ng/L (ref <18)

## 2022-06-29 NOTE — ED Notes (Signed)
Patient verbalizes understanding of discharge instructions. Opportunity for questioning and answers were provided. Patient discharged from ED.  °

## 2022-06-29 NOTE — ED Provider Notes (Signed)
[facility] DEPT Provider Note   CSN: [id number] Arrival date & time: 06/29/22  1133     History  Chief Complaint  Patient presents with   Chest Spasms    Michelle Beltran is a 46 y.o. female.  The history is provided by the patient. No language interpreter was used.  46 year old female with history of cervical degenerative disc disease presenting with intermittent spasms in the left side of her chest since last night.  She is unsure how long these episodes last.  This is caused her a great deal distress and she reports that she has felt palpitations.  Denies chest pain, shortness of breath, dysarthria, dysphagia.  She has had occasional eyelid twitching in the past.  She reports that she has been under increased stress lately.     Home Medications Prior to Admission medications   Medication Sig Start Date End Date Taking? Authorizing Provider  albuterol (VENTOLIN HFA) 108 (90 Base) MCG/ACT inhaler Inhale 2 puffs into the lungs every 4 (four) hours as needed for wheezing. 05/11/21   Janith Lima, MD  CALCIUM PO Take by mouth daily.    [provider]  Cholecalciferol (VITAMIN D3) 125 MCG (5000 UT) CAPS Take by mouth. Take 5000 units 5 days weekly.    [provider]  estradiol (VIVELLE-DOT) 0.05 MG/24HR patch APPLY 1 PATCH(0.05 MG) TOPICALLY TO THE SKIN 2 TIMES A WEEK 06/14/22   Nunzio Cobbs, MD  Multiple Vitamins-Minerals (ZINC PO) Take 1 tablet by mouth daily.    [provider]  progesterone (PROMETRIUM) 100 MG capsule Take 1 capsule (100 mg total) by mouth daily. 06/14/22   Nunzio Cobbs, MD      Allergies    Cat hair extract    Review of Systems   Review of Systems  Physical Exam Updated Vital Signs BP 120/71 (BP Location: Right Arm)   Pulse (!) 55   Temp 98.3 F (36.8 C) (Oral)   Resp 18   Ht '5\' 7"'$  (1.702 m)   Wt 70.3 kg   LMP 12/16/2019   SpO2 100%   BMI 24.27 kg/m   Physical Exam Constitutional:      General: She is not in acute distress. HENT:     Head: Normocephalic and atraumatic.     Mouth/Throat:     Mouth: Mucous membranes are moist.     Pharynx: Oropharynx is clear. No oropharyngeal exudate.  Eyes:     Extraocular Movements: Extraocular movements intact.     Pupils: Pupils are equal, round, and reactive to light.  Cardiovascular:     Rate and Rhythm: Regular rhythm. Bradycardia present.     Heart sounds: No murmur heard. Pulmonary:     Effort: Pulmonary effort is normal. No respiratory distress.     Breath sounds: Normal breath sounds.  Abdominal:     Palpations: Abdomen is soft.     Tenderness: There is no abdominal tenderness.  Musculoskeletal:     Cervical back: Neck supple.  Skin:    General: Skin is warm and dry.  Neurological:     Mental Status: She is alert.     Cranial Nerves: Cranial nerves 2-12 are intact. No cranial nerve deficit.     Sensory: Sensation is intact. No sensory deficit.     Motor: No weakness.     Comments: 5/5 strength in upper and lower extremities     ED Results / Procedures / Treatments   Labs (  all labs ordered are listed, but only abnormal results are displayed) Labs Reviewed  BASIC METABOLIC PANEL  CBC  TROPONIN I (HIGH SENSITIVITY)    EKG EKG Interpretation  Date/Time:  Thursday June 29 2022 11:46:23 EST Ventricular Rate:  57 PR Interval:  146 QRS Duration: 78 QT Interval:  424 QTC Calculation: 412 R Axis:   70 Text Interpretation: Sinus bradycardia No previous ECGs available Confirmed by Lajean Saver [phone]) on 06/29/2022 11:55:22 AM  Radiology No results found.  Procedures Procedures    Medications Ordered in ED Medications - No data to display  ED Course/ Medical Decision Making/ A&P                           Medical Decision Making Amount and/or Complexity of Data Reviewed Labs: ordered.   46 year old female with history of cervical degenerative disc disease  presenting with intermittent chest spasms since last night, no chest pain.  She is mildly bradycardic, otherwise well-appearing and neurologically intact.  She did have 1 episode of spasm that lasted a few seconds during my encounter with her, no gross visual abnormalities noted during episode.  Labs obtained in triage unremarkable, initial troponin is undetectable.  EKG reviewed shows sinus bradycardia.  I think she most likely has some muscle spasms of the chest or benign fasciculations.  She is not having any significant pain, do not feel she needs treatment for this.  Do not feel second troponin is needed.  Reassurance provided.  Final Clinical Impression(s) / ED Diagnoses Final diagnoses:  Fasciculations    Rx / DC Orders ED Discharge Orders     None         Zola Button, MD 06/29/22 1407    Lajean Saver, MD 06/29/22 1432

## 2022-06-29 NOTE — ED Notes (Signed)
Patient declined CXR Imaging, stating she would like to be seen by EDP prior to. Radiology Staff made aware.

## 2022-06-29 NOTE — ED Notes (Signed)
Pt declining CXR at this time, requesting to see "the doctor" first. Pt hesitant about changing into gown. Pt states, "I don't want to do anything that is not needed"

## 2022-06-29 NOTE — ED Triage Notes (Signed)
Patient here POV from Home.  Endorses Spasms in Chest that began Last PM and have been Intermittent since. Not Painful. No Discernable SOB.   NAD Noted during Triage. A&Ox4. GCS 15. Ambulatory.

## 2022-06-29 NOTE — Discharge Instructions (Addendum)
Your blood test look reassuring.  Follow-up with your primary care physician as needed.  Try to reduce stress and caffeine intake.

## 2022-07-03 ENCOUNTER — Telehealth: Payer: Self-pay

## 2022-07-03 NOTE — Telephone Encounter (Signed)
Transition Care Management Unsuccessful Follow-up Telephone Call  Date of discharge and from where:  [facility] 06/29/22   Attempts:  1st Attempt  Reason for unsuccessful TCM follow-up call:  Left voice message

## 2022-07-19 ENCOUNTER — Ambulatory Visit: Payer: BC Managed Care – PPO | Admitting: Rehabilitative and Restorative Service Providers"

## 2022-10-31 ENCOUNTER — Telehealth: Payer: Self-pay

## 2022-10-31 NOTE — Telephone Encounter (Signed)
Options are to switch to the Minivelle 0.05 mg transdermal patch twice weekly or switch to oral estradiol 0.5 mg daily until her annual exam is due.   Please have her update her mammogram if she does not already have an appointment.

## 2022-10-31 NOTE — Telephone Encounter (Signed)
Patient said she has been using the estradiol patch for more than a year with no problems.  However the last month she has started having burning and itching at the site of the patch. When she removes it site is red and itchy. She does rotate site with each patch.

## 2022-11-01 MED ORDER — ESTRADIOL 0.05 MG/24HR TD PTTW: 1.0000 | MEDICATED_PATCH | TRANSDERMAL | 1 refills | Status: DC

## 2022-11-01 NOTE — Telephone Encounter (Signed)
Spoke with patient. Advised per Dr. Edward Jolly.  Patient would like to try Minivelle patch. Rx sent to verified pharmacy, #24/1 RF. Next AEX due 05/2023. Patient has not updated MMG is aware overdue and will need to update to continue HRT.  Questions answered. Patient verbalizes understanding and is agreeable.   Routing to provider for final review. Patient is agreeable to disposition. Will close encounter.

## 2022-11-16 NOTE — Telephone Encounter (Addendum)
Patient called back to report that she picked up the new patch Rx and it is same as what she had. No size difference.  I spoke with pharmacy and they said she had generic Vivelle Dot patch before and that is same as the generic Minivelle that was prescribed.  Patient continues to have irritation and itching as site of patch.  10/31/22 note Dr. Edward Jolly wrote " or switch to oral estradiol 0.5 mg daily until her annual exam is due. "    I called her back and left detailed message per DPR access note and told her pharmacy did confirm no difference in those patches. I told her that the other option, per Dr. Rica Records 10/31/22 note, was to try oral estrogen until her AEX.   I asked her to call me back and let me know her thoughts.

## 2022-11-20 ENCOUNTER — Other Ambulatory Visit: Payer: Self-pay | Admitting: Obstetrics and Gynecology

## 2022-11-20 MED ORDER — ESTRADIOL 0.5 MG PO TABS: 0.5000 mg | ORAL_TABLET | Freq: Every day | ORAL | 0 refills | Status: DC

## 2022-11-20 NOTE — Telephone Encounter (Signed)
Patient continues to have problems with the patch causing itching and irritation and would like Rx for oral estrogen.

## 2022-11-20 NOTE — Progress Notes (Signed)
New Rx for oral estradiol 0.5 mg daily.  #30, RF none.

## 2022-11-20 NOTE — Telephone Encounter (Signed)
I discontinued the estradiol patch and sent in a new prescription for oral estradiol 0.5 mg daily.  #30, RF none.   This will give the patient an opportunity to try the oral version and dosage and get her mammogram up to date for further refills.   Please let her know.

## 2022-11-20 NOTE — Telephone Encounter (Addendum)
Called patient and left message that Dr. Edward Jolly sent Rx for 30 days supply to allow her to try Rx but also time to get her mammogram scheduled as she is past due and very important to keep [place] with breast health when on estrogen. I told her to let us know if needs any assistance with scheduling.

## 2022-12-18 ENCOUNTER — Other Ambulatory Visit: Payer: Self-pay | Admitting: Obstetrics and Gynecology

## 2022-12-18 NOTE — Telephone Encounter (Signed)
Med refill request:estradiol 0.5 mg tab Last AEX: 06/14/22 -BS Next AEX: Not scheduled Last MMG (if hormonal med) 09/09/21, BiRads 1 neg  Call returned to patient, left detailed message ok per dpr. Advised estradiol Rx refused, need updated MMG. If you have had a MMG at another location please return call to advise where so we can get a copy for your chart. Return call to [facility] triage at [phone], opt 4.    Routing to Dr. Marjorie Smolder  Encounter closed.

## 2022-12-19 ENCOUNTER — Telehealth: Payer: Self-pay

## 2022-12-19 ENCOUNTER — Other Ambulatory Visit: Payer: Self-pay | Admitting: Obstetrics and Gynecology

## 2022-12-19 DIAGNOSIS — Z79890 Hormone replacement therapy (postmenopausal): Secondary | ICD-10-CM

## 2022-12-19 MED ORDER — ESTRADIOL 0.5 MG PO TABS: 0.5000 mg | ORAL_TABLET | Freq: Every day | ORAL | 0 refills | Status: DC

## 2022-12-19 MED ORDER — PROGESTERONE MICRONIZED 100 MG PO CAPS: 100.0000 mg | ORAL_CAPSULE | Freq: Every day | ORAL | 0 refills | Status: DC

## 2022-12-19 NOTE — Telephone Encounter (Signed)
Pt LVM in triage line stating that her estradiol was refused because she needed to update her mammo. She states that she is about to go out of town to [facility] tomorrow for two months for work and has plans to have done when she returns and is hoping Dr. Edward Jolly will reconsider sending in refill. Please advise.   Last AEX 05/2022--recall placed for 2024.   Last mammo 09/09/2021-neg birads 1, nothing currently scheduled.

## 2022-12-19 NOTE — Telephone Encounter (Signed)
Ok to refill Estrace 0.5 mg daily, #60, RF none and Prometrium 100 mg nightly, #60, RF none.    

## 2022-12-19 NOTE — Telephone Encounter (Signed)
Pt LVM in triage line stating she scheduled her mammogram for when she returns from [place] on 03/01/2023 and she hopes this will help in your decision to consider supplying her with the refills she needs until she returns.

## 2022-12-19 NOTE — Telephone Encounter (Signed)
Pt notified and rxs sent to pharmacy on file per preference. Will route to provider and close.

## 2022-12-19 NOTE — Progress Notes (Signed)
Ok to refill Estrace 0.5 mg daily, #60, RF none and Prometrium 100 mg nightly, #60, RF none.

## 2022-12-20 ENCOUNTER — Other Ambulatory Visit: Payer: Self-pay

## 2022-12-20 DIAGNOSIS — Z1231 Encounter for screening mammogram for malignant neoplasm of breast: Secondary | ICD-10-CM

## 2023-01-07 ENCOUNTER — Telehealth: Payer: 59 | Admitting: Nurse Practitioner

## 2023-01-07 DIAGNOSIS — U071 COVID-19: Secondary | ICD-10-CM

## 2023-01-07 MED ORDER — NIRMATRELVIR/RITONAVIR (PAXLOVID)TABLET: 3.0000 | ORAL_TABLET | Freq: Two times a day (BID) | ORAL | 0 refills | Status: AC

## 2023-01-07 NOTE — Progress Notes (Signed)
Virtual Visit Consent   Michelle Beltran, you are scheduled for a virtual visit with a [facility] provider today. Just as with appointments in the office, your consent must be obtained to participate. Your consent will be active for this visit and any virtual visit you may have with one of our providers in the next 365 days. If you have a MyChart account, a copy of this consent can be sent to you electronically.  As this is a virtual visit, video technology does not allow for your provider to perform a traditional examination. This may limit your provider's ability to fully assess your condition. If your provider identifies any concerns that need to be evaluated in person or the need to arrange testing (such as labs, EKG, etc.), we will make arrangements to do so. Although advances in technology are sophisticated, we cannot ensure that it will always work on either your end or our end. If the connection with a video visit is poor, the visit may have to be switched to a telephone visit. With either a video or telephone visit, we are not always able to ensure that we have a secure connection.  By engaging in this virtual visit, you consent to the provision of healthcare and authorize for your insurance to be billed (if applicable) for the services provided during this visit. Depending on your insurance coverage, you may receive a charge related to this service.  I need to obtain your verbal consent now. Are you willing to proceed with your visit today? Michelle Beltran has provided verbal consent on 01/07/2023 for a virtual visit (video or telephone). Michelle Rigg, NP  Date: 01/07/2023 12:42 PM  Virtual Visit via Video Note   I, Michelle Beltran, connected with  Michelle Beltran  ([id number], 1976/04/21) on 01/07/23 at 12:45 PM EDT by a video-enabled telemedicine application and verified that I am speaking with the correct person using two  identifiers.  Location: Patient: Virtual Visit Location Patient: Home Provider: Virtual Visit Location Provider: Home Office   I discussed the limitations of evaluation and management by telemedicine and the availability of in person appointments. The patient expressed understanding and agreed to proceed.    History of Present Illness: Michelle Beltran is a 47 y.o. who identifies as a female who was assigned female at birth, and is being seen today for COVID POSITIVE.  Michelle Beltran tested positive for COVID this morning via home antigen test. Reports the following symptoms: sinus headache, body aches, chills.  Requesting paxlovid.    Problems:  Patient Active Problem List   Diagnosis Date Noted   Diarrhea 06/13/2022   Need for hepatitis C screening test 06/13/2022   Mild intermittent asthma without complication 05/11/2021   Premature ovarian failure 05/11/2021   Hyperlipidemia with target LDL less than 160 05/11/2021   Visit for screening mammogram 07/21/2020   ANA positive 07/20/2020   Encounter for general adult medical examination with abnormal findings 07/20/2020    Allergies:  Allergies  Allergen Reactions   Cat Hair Extract     Allergic to cats and dogs   Medications:  Current Outpatient Medications:    nirmatrelvir/ritonavir (PAXLOVID) 20 x 150 MG & 10 x 100MG  TABS, Take 3 tablets by mouth 2 (two) times daily for 5 days. (Take nirmatrelvir 150 mg two tablets twice daily for 5 days and ritonavir 100 mg one tablet twice daily for 5 days) Patient GFR is >60, Disp: 30 tablet, Rfl:  0   albuterol (VENTOLIN HFA) 108 (90 Base) MCG/ACT inhaler, Inhale 2 puffs into the lungs every 4 (four) hours as needed for wheezing., Disp: 1 each, Rfl: 5   CALCIUM PO, Take by mouth daily., Disp: , Rfl:    Cholecalciferol (VITAMIN D3) 125 MCG (5000 UT) CAPS, Take by mouth. Take 5000 units 5 days weekly., Disp: , Rfl:    estradiol (ESTRACE) 0.5 MG tablet, Take 1 tablet (0.5 mg  total) by mouth daily., Disp: 60 tablet, Rfl: 0   Multiple Vitamins-Minerals (ZINC PO), Take 1 tablet by mouth daily., Disp: , Rfl:    progesterone (PROMETRIUM) 100 MG capsule, Take 1 capsule (100 mg total) by mouth daily., Disp: 60 capsule, Rfl: 0  Observations/Objective: Patient is well-developed, well-nourished in no acute distress.  Resting comfortably at home.  Head is normocephalic, atraumatic.  No labored breathing.  Speech is clear and coherent with logical content.  Patient is alert and oriented at baseline.    Assessment and Plan: 1. COVID - nirmatrelvir/ritonavir (PAXLOVID) 20 x 150 MG & 10 x 100MG  TABS; Take 3 tablets by mouth 2 (two) times daily for 5 days. (Take nirmatrelvir 150 mg two tablets twice daily for 5 days and ritonavir 100 mg one tablet twice daily for 5 days) Patient GFR is >60  Dispense: 30 tablet; Refill: 0   Please keep well-hydrated and get plenty of rest. Start a saline nasal rinse to flush out your nasal passages. You can use plain Mucinex to help thin congestion. If you have a humidifier, you can use this daily as needed.    You are to wear a mask for 5 days from onset of your symptoms.  After day 5, if you have had no fever and you are feeling better with NO symptoms, you can end masking. Keep in mind you can be contagious 10 days from the onset of symptoms  After day 5 if you have a fever or are having significant symptoms, please wear your mask for full 10 days.   If you note any worsening of symptoms, any significant shortness of breath or any chest pain, please seek ER evaluation ASAP.  Please do not delay care!    If you note any worsening of symptoms, any significant shortness of breath or any chest pain, please seek ER evaluation ASAP.  Please do not delay care!   Follow Up Instructions: I discussed the assessment and treatment plan with the patient. The patient was provided an opportunity to ask questions and all were answered. The patient  agreed with the plan and demonstrated an understanding of the instructions.  A copy of instructions were sent to the patient via MyChart unless otherwise noted below.   The patient was advised to call back or seek an in-person evaluation if the symptoms worsen or if the condition fails to improve as anticipated.  Time:  I spent 12 minutes with the patient via telehealth technology discussing the above problems/concerns.    Michelle Rigg, NP

## 2023-01-07 NOTE — Patient Instructions (Signed)
Janeice Robinson Patrick Jupiter, thank you for joining Claiborne Rigg, NP for today's virtual visit.  While this provider is not your primary care provider (PCP), if your PCP is located in our provider database this encounter information will be shared with them immediately following your visit.   A [facility] MyChart account gives you access to today's visit and all your visits, tests, and labs performed at [facility] " click here if you don't have a [facility] MyChart account or go to [email or web address]  Consent: (Patient) Theodore Rahrig provided verbal consent for this virtual visit at the beginning of the encounter.  Current Medications:  Current Outpatient Medications:    nirmatrelvir/ritonavir (PAXLOVID) 20 x 150 MG & 10 x 100MG  TABS, Take 3 tablets by mouth 2 (two) times daily for 5 days. (Take nirmatrelvir 150 mg two tablets twice daily for 5 days and ritonavir 100 mg one tablet twice daily for 5 days) Patient GFR is >60, Disp: 30 tablet, Rfl: 0   albuterol (VENTOLIN HFA) 108 (90 Base) MCG/ACT inhaler, Inhale 2 puffs into the lungs every 4 (four) hours as needed for wheezing., Disp: 1 each, Rfl: 5   CALCIUM PO, Take by mouth daily., Disp: , Rfl:    Cholecalciferol (VITAMIN D3) 125 MCG (5000 UT) CAPS, Take by mouth. Take 5000 units 5 days weekly., Disp: , Rfl:    estradiol (ESTRACE) 0.5 MG tablet, Take 1 tablet (0.5 mg total) by mouth daily., Disp: 60 tablet, Rfl: 0   Multiple Vitamins-Minerals (ZINC PO), Take 1 tablet by mouth daily., Disp: , Rfl:    progesterone (PROMETRIUM) 100 MG capsule, Take 1 capsule (100 mg total) by mouth daily., Disp: 60 capsule, Rfl: 0   Medications ordered in this encounter:  Meds ordered this encounter  Medications   nirmatrelvir/ritonavir (PAXLOVID) 20 x 150 MG & 10 x 100MG  TABS    Sig: Take 3 tablets by mouth 2 (two) times daily for 5 days. (Take nirmatrelvir 150 mg two tablets twice daily for 5 days and ritonavir 100 mg  one tablet twice daily for 5 days) Patient GFR is >60    Dispense:  30 tablet    Refill:  0    Order Specific Question:   Supervising Provider    Answer:   Merrilee Jansky [id number]     *If you need refills on other medications prior to your next appointment, please contact your pharmacy*  Follow-Up: Call back or seek an in-person evaluation if the symptoms worsen or if the condition fails to improve as anticipated.  [facility] [phone]  Other Instructions  Please keep well-hydrated and get plenty of rest. Start a saline nasal rinse to flush out your nasal passages. You can use plain Mucinex to help thin congestion. If you have a humidifier, you can use this daily as needed.    You are to wear a mask for 5 days from onset of your symptoms.  After day 5, if you have had no fever and you are feeling better with NO symptoms, you can end masking. Keep in mind you can be contagious 10 days from the onset of symptoms  After day 5 if you have a fever or are having significant symptoms, please wear your mask for full 10 days.   If you note any worsening of symptoms, any significant shortness of breath or any chest pain, please seek ER evaluation ASAP.  Please do not delay care!    If you note  any worsening of symptoms, any significant shortness of breath or any chest pain, please seek ER evaluation ASAP.  Please do not delay care!    If you have been instructed to have an in-person evaluation today at a local Urgent Care facility, please use the link below. It will take you to a list of all of our available [facility], including address, phone number and hours of operation. Please do not delay care.  [facility]  If you or a family member do not have a primary care provider, use the link below to schedule a visit and establish care. When you choose a [facility] primary care physician or advanced practice provider, you gain a long-term partner  in health. Find a Primary Care Provider  Learn more about [facility]'s in-office and virtual care options: [facility] - Get Care Now

## 2023-02-16 ENCOUNTER — Other Ambulatory Visit: Payer: Self-pay

## 2023-02-16 DIAGNOSIS — Z79890 Hormone replacement therapy (postmenopausal): Secondary | ICD-10-CM

## 2023-02-16 NOTE — Telephone Encounter (Signed)
Med refill request: Estradiol 0.5mg  tab/Progesterone 100mg  Last AEX: 06/14/2022 Next AEX: recall placed for 05/2023 Last MMG (if hormonal med): 09/09/2021-neg birads 1; Cat B; Scheduled for 03/01/2023 Refill authorized: Rx pend.  Per telephone encounter 12/19/2022: Will hold rx refill request until pt's mammo report returns.

## 2023-02-17 ENCOUNTER — Other Ambulatory Visit: Payer: Self-pay | Admitting: Obstetrics and Gynecology

## 2023-02-17 DIAGNOSIS — Z79890 Hormone replacement therapy (postmenopausal): Secondary | ICD-10-CM

## 2023-02-19 MED ORDER — ESTRADIOL 0.5 MG PO TABS: 0.5000 mg | ORAL_TABLET | Freq: Every day | ORAL | 0 refills | Status: DC

## 2023-02-19 MED ORDER — PROGESTERONE MICRONIZED 100 MG PO CAPS: 100.0000 mg | ORAL_CAPSULE | Freq: Every day | ORAL | 0 refills | Status: DC

## 2023-02-19 NOTE — Telephone Encounter (Signed)
Rx request denied due to duplicate request. Pend request from 02/15/2023. Holding until mammogram results are received from scheduled mammo on 03/01/2023.  Will route to provider for final review.

## 2023-02-19 NOTE — Telephone Encounter (Signed)
Pt LVM in triage line requesting the additional refill on her HRT.  States she recalls talking to [place] on the phone in June about the importance of her screenings and taking the HRTs but was unable to have her mammogram done while out of town and did not receive enough rx to last her until scheduled mammo on 03/01/2023.   Refer to telephone encounter dated 12/19/2022.  Rxs pend. Please advise.

## 2023-03-01 ENCOUNTER — Ambulatory Visit
Admission: RE | Admit: 2023-03-01 | Discharge: 2023-03-01 | Disposition: A | Discharge status: Home / Self Care | Payer: 59 | Source: Ambulatory Visit

## 2023-03-01 DIAGNOSIS — Z1231 Encounter for screening mammogram for malignant neoplasm of breast: Secondary | ICD-10-CM

## 2023-03-22 ENCOUNTER — Ambulatory Visit: Payer: BC Managed Care – PPO | Admitting: Physician Assistant

## 2023-03-22 ENCOUNTER — Other Ambulatory Visit (INDEPENDENT_AMBULATORY_CARE_PROVIDER_SITE_OTHER): Payer: BC Managed Care – PPO

## 2023-03-22 ENCOUNTER — Other Ambulatory Visit: Payer: Self-pay | Admitting: Obstetrics and Gynecology

## 2023-03-22 ENCOUNTER — Encounter: Payer: Self-pay | Admitting: Physician Assistant

## 2023-03-22 DIAGNOSIS — M25572 Pain in left ankle and joints of left foot: Secondary | ICD-10-CM | POA: Diagnosis not present

## 2023-03-22 DIAGNOSIS — M79641 Pain in right hand: Secondary | ICD-10-CM

## 2023-03-22 DIAGNOSIS — Z79890 Hormone replacement therapy (postmenopausal): Secondary | ICD-10-CM

## 2023-03-22 MED ORDER — MELOXICAM 15 MG PO TABS: 15.0000 mg | ORAL_TABLET | Freq: Every day | ORAL | 0 refills | Status: DC

## 2023-03-22 NOTE — Progress Notes (Signed)
Office Visit Note   Patient: Michelle Beltran           Date of Birth: [birth year]           MRN: [id number] Visit Date: 03/22/2023              Requested by: Etta Grandchild, MD [address] [place],  [place] [address] PCP: Etta Grandchild, MD   Assessment & Plan: Visit Diagnoses:  1. Pain in right hand   2. Pain in left ankle and joints of left foot     Plan: Right hand pain.  She said this has slightly gotten better over the last month because she has done less repetitive motion with her hand.  Where it was 10/10 a month ago it is now 7-8/10.  I do not think I see anything concerning on her x-ray her exam is fairly benign.  She has good strength.  She has a negative Finkelstein's test.  Cannot really reproduce her pain that when she has it it is at her PIP joint of her thumb.  I recommended we will try a course of meloxicam she can use some Voltaren gel and again try to rest this is much as possible long-term if she continued to have problems I would make a referral to our hand specialist. Regarding her left ankle I think she just had the equivalent of a mild ankle sprain.  She has pain mostly laterally but has good stability and has 5 out of 5 strength.  Again we will put her on meloxicam I have given her some ankle strengthening exercises and a Thera-Band will contact me if she does not improve  Follow-Up Instructions: No follow-ups on file.   Orders:  Orders Placed This Encounter  Procedures   XR Hand Complete Right   XR Ankle Complete Left   No orders of the defined types were placed in this encounter.     Procedures: No procedures performed   Clinical Data: No additional findings.   Subjective: No chief complaint on file.   HPI patient is a pleasant 47 year old woman with a chief complaint of right base of thumb pain and left ankle pain.  She denies any particular injury.  She is right-hand dominant.  She said she has pain in the base of her thumb  going into the wrist when she holds heavy objects.  She thinks she has a little bit of swelling.  She gets moderate pain.  She also has pain in her left ankle with a little swelling she said sometimes it just feels "weird "  Review of Systems  All other systems reviewed and are negative.    Objective: Vital Signs: LMP 12/16/2019   Physical Exam Constitutional:      Appearance: Normal appearance.  Pulmonary:     Effort: Pulmonary effort is normal.  Neurological:     General: No focal deficit present.     Mental Status: She is alert and oriented to person, place, and time.  Psychiatric:        Mood and Affect: Mood normal.        Behavior: Behavior normal.     Ortho Exam Right hand: She is neurovascular intact strong radial pulse no redness no swelling.  She has good abductor strength has some slight reproduction of her symptoms with wrist extension but in the PIP joint does not radiate down into the first dorsal compartment.  No pain with flexion.  She has had  good opposition.  Negative Finkelstein's test. Left ankle she is neurovascular intact has a good endpoint on anterior draw has good dorsiflexion plantarflexion eversion inversion strength compartments are soft and nontender pulses are intact Specialty Comments:  No specialty comments available.  Imaging: No results found.   PMFS History: Patient Active Problem List   Diagnosis Date Noted   Pain in left ankle and joints of left foot 03/22/2023   Pain in right hand 03/22/2023   Diarrhea 06/13/2022   Need for hepatitis C screening test 06/13/2022   Mild intermittent asthma without complication 05/11/2021   Premature ovarian failure 05/11/2021   Hyperlipidemia with target LDL less than 160 05/11/2021   Visit for screening mammogram 07/21/2020   ANA positive 07/20/2020   Encounter for general adult medical examination with abnormal findings 07/20/2020   Past Medical History:  Diagnosis Date   Allergy    dogs and  cats   Anxiety    Asthma    Degenerative disc disease, lumbar    History of PCOS    STD (sexually transmitted disease) 1995   tx'd for gonorrhea   Urinary incontinence    Vitamin D deficiency     Family History  Problem Relation Age of Onset   Osteoporosis Mother    Hypertension Mother    Hyperlipidemia Mother    Hypertension Father    Hyperlipidemia Father    Arthritis Father    Heart disease Paternal Grandmother    Heart disease Paternal Grandfather     Past Surgical History:  Procedure Laterality Date   BREAST SURGERY     Reduction   finger nerve reconstruction Left 2013   left hand, 4th digit.   INDUCED ABORTION  2005   REDUCTION MAMMAPLASTY Bilateral    Social History   Occupational History   Not on file  Tobacco Use   Smoking status: Former    Types: Cigarettes   Smokeless tobacco: Never  Vaping Use   Vaping status: Every Day  Substance and Sexual Activity   Alcohol use: Yes    Comment: occ   Drug use: Not Currently    Types: Marijuana    Comment: occ.   Sexual activity: Yes    Partners: Male

## 2023-03-22 NOTE — Telephone Encounter (Signed)
Med refill request: estrace Last AEX: 06/01/22 Next AEX: not scheduled Last MMG (if hormonal med) 03/01/23 Refill authorized: Please Advise, #30, 0 RF

## 2023-04-10 ENCOUNTER — Other Ambulatory Visit: Payer: Self-pay

## 2023-04-10 DIAGNOSIS — Z79890 Hormone replacement therapy (postmenopausal): Secondary | ICD-10-CM

## 2023-04-10 MED ORDER — PROGESTERONE MICRONIZED 100 MG PO CAPS: 100.0000 mg | ORAL_CAPSULE | Freq: Every day | ORAL | 0 refills | Status: DC

## 2023-04-10 NOTE — Telephone Encounter (Signed)
Med refill request: progesterone 100mg  #30 Last AEX: 06/14/22 OV scheduled 05/02/23 (hot flashes) Next AEX: 08/16/23 Last MMG (if hormonal med) 03/01/23 BI-RADS 1 Refill sent to provider for approval.

## 2023-04-13 ENCOUNTER — Other Ambulatory Visit: Payer: Self-pay | Admitting: Obstetrics and Gynecology

## 2023-04-13 DIAGNOSIS — Z79890 Hormone replacement therapy (postmenopausal): Secondary | ICD-10-CM

## 2023-04-13 MED ORDER — PROGESTERONE MICRONIZED 100 MG PO CAPS: 100.0000 mg | ORAL_CAPSULE | Freq: Every day | ORAL | 0 refills | Status: DC

## 2023-04-13 NOTE — Telephone Encounter (Signed)
VM left for patient to call back to see if she needed refill on progesterone 100mg  prior to Dr. Edward Jolly returning to the office.

## 2023-04-17 ENCOUNTER — Other Ambulatory Visit: Payer: Self-pay | Admitting: Physician Assistant

## 2023-04-18 NOTE — Progress Notes (Signed)
GYNECOLOGY  VISIT   HPI: 47 y.o.   Married  [place]  female   [id number] with Patient's last menstrual period was 12/16/2019.   here for   hot flashes, exhaustion and trouble sleeping.  Waking up at night 2 - 3 times.  Patch caused a rash on her skin.   Hot flashes returned in May.   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  Worked for a little while only.   GYNECOLOGIC HISTORY: Patient's last menstrual period was 12/16/2019. Contraception:  PMP Menopausal hormone therapy:  estradiol Last mammogram:  03/01/23, Breast Density Category B, BI-RADS CATEGORY 1: Negative  Last pap smear:   2019 normal w/[facility], 04-17-16 Neg:Neg HR HPV         OB History     Gravida  1   Para  0   Term  0   Preterm  0   AB  1   Living         SAB  0   IAB  0   Ectopic  0   Multiple      Live Births                 Patient Active Problem List   Diagnosis Date Noted   Pain in left ankle and joints of left foot 03/22/2023   Pain in right hand 03/22/2023   Diarrhea 06/13/2022   Need for hepatitis C screening test 06/13/2022   Mild intermittent asthma without complication 05/11/2021   Premature ovarian failure 05/11/2021   Hyperlipidemia with target LDL less than 160 05/11/2021   Visit for screening mammogram 07/21/2020   ANA positive 07/20/2020   Encounter for general adult medical examination with abnormal findings 07/20/2020    Past Medical History:  Diagnosis Date   Allergy    dogs and cats   Anxiety    Asthma    Degenerative disc disease, lumbar    History of PCOS    STD (sexually transmitted disease) 1995   tx'd for gonorrhea   Urinary incontinence    Vitamin D deficiency     Past Surgical History:  Procedure Laterality Date   BREAST SURGERY     Reduction   finger nerve reconstruction Left 2013   left hand, 4th digit.   INDUCED ABORTION  2005   REDUCTION MAMMAPLASTY Bilateral     Current Outpatient Medications  Medication Sig Dispense Refill    albuterol (VENTOLIN HFA) 108 (90 Base) MCG/ACT inhaler Inhale 2 puffs into the lungs every 4 (four) hours as needed for wheezing. 1 each 5   CALCIUM PO Take by mouth daily.     Cholecalciferol (VITAMIN D3) 125 MCG (5000 UT) CAPS Take by mouth. Take 5000 units 5 days weekly.     estradiol (ESTRACE) 0.5 MG tablet TAKE 1 TABLET BY MOUTH EVERY DAY 90 tablet 0   Multiple Vitamins-Minerals (ZINC PO) Take 1 tablet by mouth daily.     progesterone (PROMETRIUM) 100 MG capsule Take 1 capsule (100 mg total) by mouth daily. 90 capsule 0   No current facility-administered medications for this visit.     ALLERGIES: Cat hair extract  Family History  Problem Relation Age of Onset   Osteoporosis Mother    Hypertension Mother    Hyperlipidemia Mother    Hypertension Father    Hyperlipidemia Father    Arthritis Father    Heart disease Paternal Grandmother    Heart disease Paternal Grandfather     Social History   Socioeconomic History  Marital status: Married    Spouse name: Not on file   Number of children: Not on file   Years of education: Not on file   Highest education level: Not on file  Occupational History   Not on file  Tobacco Use   Smoking status: Former    Types: Cigarettes   Smokeless tobacco: Never  Vaping Use   Vaping status: Every Day  Substance and Sexual Activity   Alcohol use: Yes    Comment: occ   Drug use: Not Currently    Types: Marijuana    Comment: occ.   Sexual activity: Yes    Partners: Male  Other Topics Concern   Not on file  Social History Narrative   ** Merged History Encounter **       Social Determinants of Health   Financial Resource Strain: Not on file  Food Insecurity: Not on file  Transportation Needs: Not on file  Physical Activity: Not on file  Stress: Not on file  Social Connections: Not on file  Intimate Partner Violence: Not on file    Review of Systems  All other systems reviewed and are negative.   PHYSICAL EXAMINATION:     BP 116/64 (BP Location: Left Arm, Patient Position: Sitting, Cuff Size: Normal)   Pulse 66   Ht 5\' 7"  (1.702 m)   Wt 159 lb (72.1 kg)   LMP 12/16/2019   SpO2 98%   BMI 24.90 kg/m     General appearance: alert, cooperative and appears stated age   ASSESSMENT  Menopausal symptoms.  Encounter for medication adjustment.    PLAN  Options reviewed:  increase in oral estradiol, Femring, SSRI/SNRI, Gabapentin, [place].  Risks and benefits reviewed.  Will increase to Estrace 1 mg daily and continue Prometrium 100 mg q hs.  Keep annual exam in January, 2025.    24 min  total time was spent for this patient encounter, including preparation, face-to-face counseling with the patient, coordination of care, and documentation of the encounter.

## 2023-04-24 ENCOUNTER — Other Ambulatory Visit: Payer: Self-pay | Admitting: Physician Assistant

## 2023-05-02 ENCOUNTER — Encounter: Payer: Self-pay | Admitting: Obstetrics and Gynecology

## 2023-05-02 ENCOUNTER — Ambulatory Visit: Payer: BC Managed Care – PPO | Admitting: Obstetrics and Gynecology

## 2023-05-02 VITALS — BP 116/64 | HR 66 | Ht 67.0 in | Wt 159.0 lb

## 2023-05-02 DIAGNOSIS — Z79890 Hormone replacement therapy (postmenopausal): Secondary | ICD-10-CM

## 2023-05-02 DIAGNOSIS — Z5189 Encounter for other specified aftercare: Secondary | ICD-10-CM

## 2023-05-02 MED ORDER — ESTRADIOL 1 MG PO TABS: 1.0000 mg | ORAL_TABLET | Freq: Every day | ORAL | 0 refills | Status: DC

## 2023-05-05 LAB — LAB REPORT - SCANNED
EGFR: 95
HM HIV Screening: NEGATIVE

## 2023-05-24 ENCOUNTER — Other Ambulatory Visit: Payer: Self-pay | Admitting: Obstetrics and Gynecology

## 2023-05-24 DIAGNOSIS — Z79890 Hormone replacement therapy (postmenopausal): Secondary | ICD-10-CM

## 2023-05-24 NOTE — Telephone Encounter (Signed)
Med refill request: estradiol 1 mg tablet Last AEX: 06/14/22 Next AEX: 0-1/30/25 Last MMG (if hormonal med) 03/01/23 BI-RADS 1 Last refilled 05/02/23 estradiol 1 mg tablet #90. Sent to provider for denial if appropriate.

## 2023-07-25 ENCOUNTER — Ambulatory Visit: Payer: 59 | Admitting: Orthopaedic Surgery

## 2023-07-25 ENCOUNTER — Encounter: Payer: Self-pay | Admitting: Orthopaedic Surgery

## 2023-07-25 DIAGNOSIS — R200 Anesthesia of skin: Secondary | ICD-10-CM

## 2023-07-25 NOTE — Progress Notes (Signed)
 Office Visit Note   Patient: Michelle Beltran           Date of Birth: [birth year]           MRN: [id number] Visit Date: 07/25/2023              Requested by: Joshua Debby CROME, MD [address] [place],  [place] [address] PCP: Joshua Debby CROME, MD   Assessment & Plan: Visit Diagnoses:  1. Numbness of right thumb     Plan: 48 year old female with subjective dysesthesia to the right dorsal thumb.  No focal deficits or concerning findings.  Would recommend just monitoring this for now.  Activity modification as needed.  Follow-up as needed.  Follow-Up Instructions: No follow-ups on file.   Orders:  No orders of the defined types were placed in this encounter.  No orders of the defined types were placed in this encounter.     Procedures: No procedures performed   Clinical Data: No additional findings.   Subjective: Chief Complaint  Patient presents with   Right Thumb - Numbness    HPI Patient is a 48 year old female here for evaluation of numbness to the top of the right thumb.  She reports that back in December when she was reaching around her back to do cupping she felt a pulling sensation and since then she has had numbness to the top of the thumb.  Denies any pain.  Right-hand-dominant.  It only happens when she does certain movements.  For the most part this does not affect hand function. Review of Systems  Constitutional: Negative.   HENT: Negative.    Eyes: Negative.   Respiratory: Negative.    Cardiovascular: Negative.   Endocrine: Negative.   Musculoskeletal: Negative.   Neurological: Negative.   Hematological: Negative.   Psychiatric/Behavioral: Negative.    All other systems reviewed and are negative.    Objective: Vital Signs: LMP 12/16/2019   Physical Exam Vitals and nursing note reviewed.  Constitutional:      Appearance: She is well-developed.  HENT:     Head: Atraumatic.     Nose: Nose normal.  Eyes:     Extraocular  Movements: Extraocular movements intact.  Cardiovascular:     Pulses: Normal pulses.  Pulmonary:     Effort: Pulmonary effort is normal.  Abdominal:     Palpations: Abdomen is soft.  Musculoskeletal:     Cervical back: Neck supple.  Skin:    General: Skin is warm.     Capillary Refill: Capillary refill takes less than 2 seconds.  Neurological:     Mental Status: She is alert. Mental status is at baseline.  Psychiatric:        Behavior: Behavior normal.        Thought Content: Thought content normal.        Judgment: Judgment normal.     Ortho Exam Examination of the right thumb shows full motor function.  Negative carpal tunnel compressive signs.  Negative Finkelstein's.  Negative CMC grind test.  Reports subjective numbness to the dorsal aspect of the proximal phalanx.  No functional deficits. Specialty Comments:  No specialty comments available.  Imaging: No results found.   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   Hyperlipidemia with target LDL less than 160 05/11/2021   Visit for screening mammogram 07/21/2020   ANA positive 07/20/2020   Encounter for general adult medical examination with abnormal findings 07/20/2020   Past Medical History:  Diagnosis Date   Allergy    dogs and cats   Anxiety    Asthma    Degenerative disc disease, lumbar    History of PCOS    STD (sexually transmitted disease) 1995   tx'd for gonorrhea   Urinary incontinence    Vitamin D  deficiency     Family History  Problem Relation Age of Onset   Osteoporosis Mother    Hypertension Mother    Hyperlipidemia Mother    Hypertension Father    Hyperlipidemia Father    Arthritis Father    Heart disease  Paternal Grandmother    Heart disease Paternal Grandfather     Past Surgical History:  Procedure Laterality Date   BREAST SURGERY     Reduction   finger nerve reconstruction Left 2013   left hand, 4th digit.   INDUCED ABORTION  2005   REDUCTION MAMMAPLASTY Bilateral    Social History   Occupational History   Not on file  Tobacco Use   Smoking status: Former    Types: Cigarettes   Smokeless tobacco: Never  Vaping Use   Vaping status: Every Day  Substance and Sexual Activity   Alcohol use: Yes    Comment: occ   Drug use: Not Currently    Types: Marijuana    Comment: occ.   Sexual activity: Yes    Partners: Male

## 2023-08-02 NOTE — Progress Notes (Deleted)
 48 y.o. G65P0010 Married [place] female here for annual exam.    PCP: Etta Grandchild, MD   Patient's last menstrual period was 12/16/2019.           Sexually active: Yes.    The current method of family planning is post menopausal status.    Menopausal hormone therapy:  estradiol Exercising: {yes no:314532}  {types:19826} Smoker:  no  OB History  Gravida Para Term Preterm AB Living  1 0 0 0 1   SAB IAB Ectopic Multiple Live Births  0 0 0      # Outcome Date GA Lbr Len/2nd Weight Sex Type Anes PTL Lv  1 AB              HEALTH MAINTENANCE: Last 2 paps:  2019 normal w/[facility], 04-17-16 Neg:Neg HR HPV  History of abnormal Pap or positive HPV:  no Mammogram:   03/01/23, Breast Density Category B, BI-RADS CATEGORY 1: Negative  Colonoscopy:  05/21/20 Bone Density:  n/a  Result  n/a   Immunization History  Administered Date(s) Administered   PFIZER(Purple Top)SARS-COV-2 Vaccination 10/11/2019, 11/04/2019, 05/07/2020   Tdap 08/30/2011, 06/13/2022      reports that she has quit smoking. Her smoking use included cigarettes. She has never used smokeless tobacco. She reports current alcohol use. She reports that she does not currently use drugs after having used the following drugs: Marijuana.  Past Medical History:  Diagnosis Date   Allergy    dogs and cats   Anxiety    Asthma    Degenerative disc disease, lumbar    History of PCOS    STD (sexually transmitted disease) 1995   tx'd for gonorrhea   Urinary incontinence    Vitamin D deficiency     Past Surgical History:  Procedure Laterality Date   BREAST SURGERY     Reduction   finger nerve reconstruction Left 2013   left hand, 4th digit.   INDUCED ABORTION  2005   REDUCTION MAMMAPLASTY Bilateral     Current Outpatient Medications  Medication Sig Dispense Refill   albuterol (VENTOLIN HFA) 108 (90 Base) MCG/ACT inhaler Inhale 2 puffs into the lungs every 4 (four) hours as needed for wheezing. 1 each 5    CALCIUM PO Take by mouth daily.     Cholecalciferol (VITAMIN D3) 125 MCG (5000 UT) CAPS Take by mouth. Take 5000 units 5 days weekly.     estradiol (ESTRACE) 1 MG tablet TAKE 1 TABLET BY MOUTH ONCE DAILY 90 tablet 0   Multiple Vitamins-Minerals (ZINC PO) Take 1 tablet by mouth daily.     progesterone (PROMETRIUM) 100 MG capsule Take 1 capsule (100 mg total) by mouth daily. 90 capsule 0   No current facility-administered medications for this visit.    ALLERGIES: Cat dander  Family History  Problem Relation Age of Onset   Osteoporosis Mother    Hypertension Mother    Hyperlipidemia Mother    Hypertension Father    Hyperlipidemia Father    Arthritis Father    Heart disease Paternal Grandmother    Heart disease Paternal Grandfather     Review of Systems  PHYSICAL EXAM:  LMP 12/16/2019     General appearance: alert, cooperative and appears stated age Head: normocephalic, without obvious abnormality, atraumatic Neck: no adenopathy, supple, symmetrical, trachea midline and thyroid normal to inspection and palpation Lungs: clear to auscultation bilaterally Breasts: normal appearance, no masses or tenderness, No nipple retraction or dimpling, No nipple discharge or bleeding,  No axillary adenopathy Heart: regular rate and rhythm Abdomen: soft, non-tender; no masses, no organomegaly Extremities: extremities normal, atraumatic, no cyanosis or edema Skin: skin color, texture, turgor normal. No rashes or lesions Lymph nodes: cervical, supraclavicular, and axillary nodes normal. Neurologic: grossly normal  Pelvic: External genitalia:  no lesions              No abnormal inguinal nodes palpated.              Urethra:  normal appearing urethra with no masses, tenderness or lesions              Bartholins and Skenes: normal                 Vagina: normal appearing vagina with normal color and discharge, no lesions              Cervix: no lesions              Pap taken: {yes  no:314532} Bimanual Exam:  Uterus:  normal size, contour, position, consistency, mobility, non-tender              Adnexa: no mass, fullness, tenderness              Rectal exam: {yes no:314532}.  Confirms.              Anus:  normal sphincter tone, no lesions  Chaperone was present for exam:  {BSCHAPERONE:31226::"Traver Meckes F, CMA"}  ASSESSMENT: Well woman visit with gynecologic exam  ***  PLAN: Mammogram screening discussed. Self breast awareness reviewed. Pap and HRV collected:  {yes no:314532} Guidelines for Calcium, Vitamin D, regular exercise program including cardiovascular and weight bearing exercise. Medication refills:  *** {LABS (Optional):23779} Follow up:  ***    Additional counseling given.  {yes [id number]. ***  total time was spent for this patient encounter, including preparation, face-to-face counseling with the patient, coordination of care, and documentation of the encounter in addition to doing the well woman visit with gynecologic exam.

## 2023-08-16 ENCOUNTER — Ambulatory Visit: Payer: Self-pay | Admitting: Obstetrics and Gynecology

## 2023-08-20 ENCOUNTER — Other Ambulatory Visit: Payer: Self-pay | Admitting: Obstetrics and Gynecology

## 2023-08-20 DIAGNOSIS — Z79890 Hormone replacement therapy (postmenopausal): Secondary | ICD-10-CM

## 2023-08-29 ENCOUNTER — Other Ambulatory Visit: Payer: Self-pay | Admitting: Obstetrics and Gynecology

## 2023-08-29 DIAGNOSIS — Z79890 Hormone replacement therapy (postmenopausal): Secondary | ICD-10-CM

## 2023-08-29 NOTE — Telephone Encounter (Signed)
Medication refill request: estradiol 1mg  tablet Last AEX:  05-02-23 Next AEX: 08-16-23 no showed. Message sent to scheduling department to call her to schedule Last MMG (if hormonal medication request): 03-01-23 Refill authorized: please approve or deny as appropriate

## 2023-09-19 ENCOUNTER — Other Ambulatory Visit: Payer: Self-pay

## 2023-09-19 DIAGNOSIS — Z79890 Hormone replacement therapy (postmenopausal): Secondary | ICD-10-CM

## 2023-09-19 MED ORDER — PROGESTERONE MICRONIZED 100 MG PO CAPS: 100.0000 mg | ORAL_CAPSULE | Freq: Every day | ORAL | 1 refills | Status: DC

## 2023-09-19 NOTE — Telephone Encounter (Signed)
 Med refill request:progesterone 100 mg Last OV: HRT 05/02/23 Last AEX: 06/14/22 Next AEX: 01/29/24 Last MMG (if hormonal med) 03/01/23 BI-RADS 1 negative Refill authorized:  progesterone 100 mg #90 with one refill.  Sent to provider for approval or denial as appropriate.

## 2023-11-19 ENCOUNTER — Other Ambulatory Visit: Payer: Self-pay | Admitting: Obstetrics and Gynecology

## 2023-11-19 DIAGNOSIS — Z79890 Hormone replacement therapy (postmenopausal): Secondary | ICD-10-CM

## 2023-11-20 ENCOUNTER — Encounter: Payer: Self-pay | Admitting: Obstetrics and Gynecology

## 2023-11-20 NOTE — Telephone Encounter (Signed)
 Medication refill request: estrace   Last OV:  05/02/23 Next AEX: 01/29/24 Last MMG (if hormonal medication request): 03/01/23 bi-rads 1 neg  Refill authorized: please advise

## 2023-11-26 ENCOUNTER — Other Ambulatory Visit: Payer: Self-pay | Admitting: Obstetrics and Gynecology

## 2023-11-26 DIAGNOSIS — Z79890 Hormone replacement therapy (postmenopausal): Secondary | ICD-10-CM

## 2023-11-26 NOTE — Telephone Encounter (Signed)
 Medication refill request: progesterone    Last AEX:  06/14/22 Next AEX: 01/29/24 Last MMG (if hormonal medication request): 03/05/23 Bi-rads 1 neg  Refill authorized: please advise

## 2023-11-28 ENCOUNTER — Other Ambulatory Visit: Payer: Self-pay | Admitting: Obstetrics and Gynecology

## 2023-11-28 DIAGNOSIS — Z79890 Hormone replacement therapy (postmenopausal): Secondary | ICD-10-CM

## 2023-11-28 NOTE — Telephone Encounter (Signed)
 Rx refill refused.   Rx sent on 11/20/2023 for #90 with zero refills.

## 2024-01-28 NOTE — Progress Notes (Unsigned)
 48 y.o. G89P0010 Married [place] female here for annual exam.    PCP: Joshua Debby CROME, MD   Patient's last menstrual period was 12/16/2019.           Sexually active: Yes.    The current method of family planning is none.    Menopausal hormone therapy:  Estrace  and progesterone   Exercising: {yes no:314532}  {types:19826} Smoker:  Former   OB History  Gravida Para Term Preterm AB Living  1 0 0 0 1   SAB IAB Ectopic Multiple Live Births  0 0 0      # Outcome Date GA Lbr Len/2nd Weight Sex Type Anes PTL Lv  1 AB              HEALTH MAINTENANCE: Last 2 paps:  2019 normal - [facility], 04/17/16 neg History of abnormal Pap or positive HPV:  no Mammogram:   03/01/23 Breast Density Cat B, BIRADS Cat 1 neg  Colonoscopy:  05/21/20 Bone Density:  n/a  Result  n/a   Immunization History  Administered Date(s) Administered   PFIZER(Purple Top)SARS-COV-2 Vaccination 10/11/2019, 11/04/2019, 05/07/2020   Tdap 08/30/2011, 06/13/2022      reports that she has quit smoking. Her smoking use included cigarettes. She has never used smokeless tobacco. She reports current alcohol use. She reports that she does not currently use drugs after having used the following drugs: Marijuana.  Past Medical History:  Diagnosis Date   Allergy    dogs and cats   Anxiety    Asthma    Degenerative disc disease, lumbar    History of PCOS    STD (sexually transmitted disease) 1995   tx'd for gonorrhea   Urinary incontinence    Vitamin D  deficiency     Past Surgical History:  Procedure Laterality Date   BREAST SURGERY     Reduction   finger nerve reconstruction Left 2013   left hand, 4th digit.   INDUCED ABORTION  2005   REDUCTION MAMMAPLASTY Bilateral     Current Outpatient Medications  Medication Sig Dispense Refill   albuterol  (VENTOLIN  HFA) 108 (90 Base) MCG/ACT inhaler Inhale 2 puffs into the lungs every 4 (four) hours as needed for wheezing. 1 each 5   CALCIUM PO Take by mouth  daily.     Cholecalciferol (VITAMIN D3) 125 MCG (5000 UT) CAPS Take by mouth. Take 5000 units 5 days weekly.     estradiol  (ESTRACE ) 1 MG tablet TAKE 1 TABLET BY MOUTH ONCE DAILY 90 tablet 0   Multiple Vitamins-Minerals (ZINC PO) Take 1 tablet by mouth daily.     progesterone  (PROMETRIUM ) 100 MG capsule TAKE 1 CAPSULE BY MOUTH EVERY DAY 90 capsule 0   No current facility-administered medications for this visit.    ALLERGIES: Cat dander  Family History  Problem Relation Age of Onset   Osteoporosis Mother    Hypertension Mother    Hyperlipidemia Mother    Hypertension Father    Hyperlipidemia Father    Arthritis Father    Heart disease Paternal Grandmother    Heart disease Paternal Grandfather     Review of Systems  PHYSICAL EXAM:  LMP 12/16/2019     General appearance: alert, cooperative and appears stated age Head: normocephalic, without obvious abnormality, atraumatic Neck: no adenopathy, supple, symmetrical, trachea midline and thyroid  normal to inspection and palpation Lungs: clear to auscultation bilaterally Breasts: normal appearance, no masses or tenderness, No nipple retraction or dimpling, No nipple discharge or bleeding, No axillary  adenopathy Heart: regular rate and rhythm Abdomen: soft, non-tender; no masses, no organomegaly Extremities: extremities normal, atraumatic, no cyanosis or edema Skin: skin color, texture, turgor normal. No rashes or lesions Lymph nodes: cervical, supraclavicular, and axillary nodes normal. Neurologic: grossly normal  Pelvic: External genitalia:  no lesions              No abnormal inguinal nodes palpated.              Urethra:  normal appearing urethra with no masses, tenderness or lesions              Bartholins and Skenes: normal                 Vagina: normal appearing vagina with normal color and discharge, no lesions              Cervix: no lesions              Pap taken: {yes no:314532} Bimanual Exam:  Uterus:  normal size,  contour, position, consistency, mobility, non-tender              Adnexa: no mass, fullness, tenderness              Rectal exam: {yes no:314532}.  Confirms.              Anus:  normal sphincter tone, no lesions  Chaperone was present for exam:  {BSCHAPERONE:31226::Emily F, CMA}  ASSESSMENT: Well woman visit with gynecologic exam.  PHQ-2-9: ***  ***  PLAN: Mammogram screening discussed. Self breast awareness reviewed. Pap and HRV collected:  {yes no:314532} Guidelines for Calcium, Vitamin D , regular exercise program including cardiovascular and weight bearing exercise. Medication refills:  *** {LABS (Optional):23779} Follow up:  ***    Additional counseling given.  {yes [id number]. ***  total time was spent for this patient encounter, including preparation, face-to-face counseling with the patient, coordination of care, and documentation of the encounter in addition to doing the well woman visit with gynecologic exam.

## 2024-01-29 ENCOUNTER — Encounter: Payer: Self-pay | Admitting: Obstetrics and Gynecology

## 2024-01-29 ENCOUNTER — Ambulatory Visit (INDEPENDENT_AMBULATORY_CARE_PROVIDER_SITE_OTHER): Payer: Self-pay | Admitting: Obstetrics and Gynecology

## 2024-01-29 ENCOUNTER — Other Ambulatory Visit (HOSPITAL_COMMUNITY)
Admission: RE | Admit: 2024-01-29 | Discharge: 2024-01-29 | Disposition: A | Discharge status: Home / Self Care | Source: Ambulatory Visit | Attending: Obstetrics and Gynecology | Admitting: Obstetrics and Gynecology

## 2024-01-29 VITALS — BP 124/82 | HR 72 | Ht 69.0 in | Wt 156.0 lb

## 2024-01-29 DIAGNOSIS — Z124 Encounter for screening for malignant neoplasm of cervix: Secondary | ICD-10-CM

## 2024-01-29 DIAGNOSIS — Z01419 Encounter for gynecological examination (general) (routine) without abnormal findings: Secondary | ICD-10-CM | POA: Diagnosis not present

## 2024-01-29 DIAGNOSIS — Z1331 Encounter for screening for depression: Secondary | ICD-10-CM

## 2024-01-29 DIAGNOSIS — Z79890 Hormone replacement therapy (postmenopausal): Secondary | ICD-10-CM

## 2024-01-29 MED ORDER — PROGESTERONE MICRONIZED 100 MG PO CAPS: 100.0000 mg | ORAL_CAPSULE | Freq: Every day | ORAL | 3 refills | Status: AC

## 2024-01-29 MED ORDER — ESTRADIOL 1 MG PO TABS: 1.0000 mg | ORAL_TABLET | Freq: Every day | ORAL | 3 refills | Status: AC

## 2024-01-29 NOTE — Patient Instructions (Addendum)
 Calcium in Foods Calcium is a mineral in the body. Of all the minerals in your body, you have the most calcium. Most of the body's calcium supply is stored in bones and teeth. Calcium helps many parts of the body work, including: Blood and blood vessels. Nerves. Hormones. Muscles. Bones and teeth. When your calcium stores are low, you may be at risk for low bone mass, bone loss, and broken bones. When you get enough calcium, it helps to support strong bones and teeth throughout your life. Calcium is especially important for: Children during growth spurts. Females during adolescence. Females who are pregnant or breastfeeding. Females after their menstrual cycle stops (postmenopausal). Females whose menstrual cycle has stopped because of an eating disorder or regular intense exercise. People who can't eat or digest dairy products. People who eat a vegan diet. Recommended daily amounts of calcium: Females (ages 87 to 55): 1,000 mg per day. Females (ages 35 and older): 1,200 mg per day. Males (ages 53 to 45): 1,000 mg per day. Males (ages 43 and older): 1,200 mg per day. Females (ages 88 to 28): 1,300 mg per day. Males (ages 41 to 56): 1,300 mg per day. General information Eat foods that are high in calcium. Try to get most of your calcium from food. Some people may benefit from taking calcium supplements. Check with your health care provider or an expert in healthy eating called a dietitian before starting any calcium supplements. Calcium supplements may interact with certain medicines. Too much calcium may cause other health problems, such as trouble pooping and kidney stones. For the body to absorb calcium, it needs vitamin D. Sources of vitamin D include: Skin exposure to direct sunlight. Foods, such as egg yolks, liver, mushrooms, saltwater fish, and fortified milk. Vitamin D supplements. Check with your provider or dietitian before starting any vitamin D supplements. The amount of  calcium that is absorbed in the body varies with type of food. Talk to a dietitian about what foods are best for you, especially if you are eat a vegan diet or don't eat dairy. What foods are high in calcium?  Foods that are high in calcium contain more than 100 milligrams per serving. Fruits Fortified orange juice or other fruit juice, 300 mg per 8 oz (237 mL) serving. Vegetables Collard greens, 260 mg per 1 cup (130 g) serving, cooked. Kale, 180 mg per 1 cup (118 g) serving, cooked. Bok choy, 180 mg per 1 cup (170 g) serving, cooked Grains Fortified frozen waffles, 200 mg in 2 waffles. Oatmeal, 180 mg in 1 cup (234 g) serving, cooked. Fortified white bread, 175 mg per slice. Meats and other proteins Sardines, canned with bones, 350 mg per 3.75 oz (92 g) serving. Salmon, canned with bones, 168 mg per 3 oz (85 g) serving. Canned shrimp, 125 mg per 3 oz (85 g) serving. Baked beans, 120 mg per 1 cup (266 g) serving. Tofu, firm, made with calcium sulfate, 861 mg per  cup (126 g) serving. Dairy Yogurt, plain, low-fat, 448 mg per 1 cup (245 g) serving Nonfat milk, 300 mg per 1 cup (245 g) serving. American cheese, 145 mg per 1 oz (21 g) serving or 1 slice. Cheddar cheese, 200 mg per 1 oz (28 g) serving or 1 slice. Cottage cheese 2%, 125 mg per  cup (113 g) serving. Fortified soy, rice, or almond milk, 300 mg per 1 cup (237 mL) serving. Mozzarella, part skim, 210 mg per 1 oz (21 g) serving. The items listed  above may not be a complete list of foods high in calcium. Actual amounts of calcium may be different depending on processing. Contact a dietitian for more information. What foods are lower in calcium? Foods that are lower in calcium contain 50 mg or less per serving. Fruits Apple, 1 medium, about 6 mg. Banana, 1 medium, about 12 mg. Vegetables Lettuce, 19 mg per 1 cup (35 g) serving. Tomato, 1 small, about 11 mg. Grains Rice, white, 8 mg per  cup (79 g) serving. Boiled  potatoes, 14 mg per 1 cup (160 g) serving. White bread, 6 mg per slice. Meats and other proteins Egg, 24 mg per 1 egg (50 g). Red meat, 7 mg per 4 oz (80 g) serving. Chicken, 17 mg per 4 oz (113 g) serving. Fish, cod, or trout, 20 mg per 4 oz (140 g) serving. Dairy Cream cheese, regular, 14 mg per 1 Tbsp (15 g) serving. Brie cheese, 50 mg per 1 oz (32 g) serving. The items listed above may not be a complete list of foods lower in calcium. Actual amounts of calcium may be different depending on processing. Contact a dietitian for more information. This information is not intended to replace advice given to you by your health care provider. Make sure you discuss any questions you have with your health care provider. Document Revised: 03/31/2023 Document Reviewed: 03/31/2023 Elsevier Patient Education  2024 [employer] Inc.  EXERCISE AND DIET:  We recommended that you start or continue a regular exercise program for good health. Regular exercise means any activity that makes your heart beat faster and makes you sweat.  We recommend exercising at least 30 minutes per day at least 3 days a week, preferably 4 or 5.  We also recommend a diet low in fat and sugar.  Inactivity, poor dietary choices and obesity can cause diabetes, heart attack, stroke, and kidney damage, among others.    ALCOHOL AND SMOKING:  Women should limit their alcohol intake to no more than 7 drinks/beers/glasses of wine (combined, not each!) per week. Moderation of alcohol intake to this level decreases your risk of breast cancer and liver damage. And of course, no recreational drugs are part of a healthy lifestyle.  And absolutely no smoking or even second hand smoke. Most people know smoking can cause heart and lung diseases, but did you know it also contributes to weakening of your bones? Aging of your skin?  Yellowing of your teeth and nails?  CALCIUM AND VITAMIN D:  Adequate intake of calcium and Vitamin D are recommended.  The  recommendations for exact amounts of these supplements seem to change often, but generally speaking 600 mg of calcium (either carbonate or citrate) and 800 units of Vitamin D per day seems prudent. Certain women may benefit from higher intake of Vitamin D.  If you are among these women, your doctor will have told you during your visit.    PAP SMEARS:  Pap smears, to check for cervical cancer or precancers,  have traditionally been done yearly, although recent scientific advances have shown that most women can have pap smears less often.  However, every woman still should have a physical exam from her gynecologist every year. It will include a breast check, inspection of the vulva and vagina to check for abnormal growths or skin changes, a visual exam of the cervix, and then an exam to evaluate the size and shape of the uterus and ovaries.  And after 50 years of age, a rectal exam  is indicated to check for rectal cancers. We will also provide age appropriate advice regarding health maintenance, like when you should have certain vaccines, screening for sexually transmitted diseases, bone density testing, colonoscopy, mammograms, etc.   MAMMOGRAMS:  All women over 11 years old should have a yearly mammogram. Many facilities now offer a "3D" mammogram, which may cost around $50 extra out of pocket. If possible,  we recommend you accept the option to have the 3D mammogram performed.  It both reduces the number of women who will be called back for extra views which then turn out to be normal, and it is better than the routine mammogram at detecting truly abnormal areas.    COLONOSCOPY:  Colonoscopy to screen for colon cancer is recommended for all women at age 8.  We know, you hate the idea of the prep.  We agree, BUT, having colon cancer and not knowing it is worse!!  Colon cancer so often starts as a polyp that can be seen and removed at colonscopy, which can quite literally save your life!  And if your first  colonoscopy is normal and you have no family history of colon cancer, most women don't have to have it again for 10 years.  Once every ten years, you can do something that may end up saving your life, right?  We will be happy to help you get it scheduled when you are ready.  Be sure to check your insurance coverage so you understand how much it will cost.  It may be covered as a preventative service at no cost, but you should check your particular policy.

## 2024-01-30 ENCOUNTER — Other Ambulatory Visit: Payer: Self-pay | Admitting: Obstetrics and Gynecology

## 2024-01-30 ENCOUNTER — Encounter: Payer: Self-pay | Admitting: Obstetrics and Gynecology

## 2024-01-30 DIAGNOSIS — Z1231 Encounter for screening mammogram for malignant neoplasm of breast: Secondary | ICD-10-CM

## 2024-02-06 LAB — CYTOLOGY - PAP
Comment: NEGATIVE
Diagnosis: NEGATIVE
High risk HPV: NEGATIVE

## 2024-02-07 ENCOUNTER — Ambulatory Visit: Payer: Self-pay | Admitting: Nurse Practitioner

## 2024-02-13 ENCOUNTER — Ambulatory Visit (INDEPENDENT_AMBULATORY_CARE_PROVIDER_SITE_OTHER): Admitting: Family Medicine

## 2024-02-13 ENCOUNTER — Encounter (HOSPITAL_BASED_OUTPATIENT_CLINIC_OR_DEPARTMENT_OTHER): Payer: Self-pay | Admitting: Family Medicine

## 2024-02-13 VITALS — BP 102/76 | HR 59 | Ht 69.0 in | Wt 158.6 lb

## 2024-02-13 DIAGNOSIS — Z91018 Allergy to other foods: Secondary | ICD-10-CM | POA: Diagnosis not present

## 2024-02-13 DIAGNOSIS — M542 Cervicalgia: Secondary | ICD-10-CM | POA: Diagnosis not present

## 2024-02-13 DIAGNOSIS — M545 Low back pain, unspecified: Secondary | ICD-10-CM

## 2024-02-13 DIAGNOSIS — R768 Other specified abnormal immunological findings in serum: Secondary | ICD-10-CM | POA: Diagnosis not present

## 2024-02-13 NOTE — Assessment & Plan Note (Signed)
 Report of mouth sensitivity with certain foods including jalapeno, some alcohol, suspected red wine.  Generally, symptoms described do seem somewhat atypical for true food allergy.  Given patient's concern, can proceed with referral today for further evaluation with allergist

## 2024-02-13 NOTE — Progress Notes (Signed)
 New Patient Office Visit  Subjective   Patient ID: Michelle Beltran, female    DOB: 02-Apr-1976  Age: 48 y.o. MRN: [id number]  CC:  Chief Complaint  Patient presents with   New Patient (Initial Visit)    Establishing Care    HPI Michelle Beltran Michelle Beltran presents to establish care Last PCP - Dr. Almarie Beltran  Concerned about dietary sensitivities. Reports that she has noted with exposure to jalapeos, red wine she has had mouth sensitivity.  She reports that when she has had this issue in the past, she has been treated with prednisone , oral antihistamine.  She recalls at least 3-4 episodes in the past where this has occurred.  She denies any other symptoms such as rash or GI symptoms.  History of lumbar and cervical spine pain. Lumbar spine pain has been going on for longer.  She has seen orthopedic specialist in the past.  Primary management has been conservative.  She has had x-rays completed previously and it does appear that the MRI has been ordered, however it does not appear that this has been completed.  She was seeing Dr. Anderson previously.  She reports at one point she was seeing separate providers for the neck and for the lumbar spine.  She would be open to reestablishing with orthopedic surgeon, but would prefer one that can address both cervical and lumbar spine.  Has had positive ANA in the past.  Has had prior workup with rheumatology.  Additional testing evaluation has generally been reassuring in regards to autoimmune evaluation.  She additionally had repeat ANA after prior positive finding which was negative.  Given previously positive ANA, she is wanting to have retesting at this  She discusses that she would like to be able to identify 1 particular underlying cause for her medical issues including underlying asthma, degenerative disc disease, previously positive ANA.  Patient is originally from [place], has been here for about 13 years. Patient works at  [employer], [employer] studies. She enjoys cooking, listening to music, dancing.  Outpatient Encounter Medications as of 02/13/2024  Medication Sig   albuterol  (VENTOLIN  HFA) 108 (90 Base) MCG/ACT inhaler Inhale 2 puffs into the lungs every 4 (four) hours as needed for wheezing.   CALCIUM PO Take by mouth daily.   Cholecalciferol (VITAMIN D3) 125 MCG (5000 UT) CAPS Take by mouth. Take 5000 units 5 days weekly.   EPINEPHrine  0.3 mg/0.3 mL IJ SOAJ injection Inject into the muscle.   estradiol  (ESTRACE ) 1 MG tablet Take 1 tablet (1 mg total) by mouth daily.   Multiple Vitamins-Minerals (ZINC PO) Take 1 tablet by mouth daily.   progesterone  (PROMETRIUM ) 100 MG capsule Take 1 capsule (100 mg total) by mouth daily.   No facility-administered encounter medications on file as of 02/13/2024.    Past Medical History:  Diagnosis Date   Allergy    dogs and cats   Anxiety    Asthma    Degenerative disc disease, lumbar    History of PCOS    STD (sexually transmitted disease) 1995   tx'd for gonorrhea   Urinary incontinence    Vitamin D  deficiency     Past Surgical History:  Procedure Laterality Date   BREAST SURGERY     Reduction   finger nerve reconstruction Left 2013   left hand, 4th digit.   INDUCED ABORTION  2005   REDUCTION MAMMAPLASTY Bilateral     Family History  Problem Relation Age of Onset   Osteoporosis  Mother    Hypertension Mother    Hyperlipidemia Mother    Hypertension Father    Hyperlipidemia Father    Arthritis Father    Heart disease Paternal Grandmother    Heart disease Paternal Grandfather     Social History   Socioeconomic History   Marital status: Married    Spouse name: Not on file   Number of children: Not on file   Years of education: Not on file   Highest education level: Master's degree (e.g., MA, MS, MEng, MEd, MSW, MBA)  Occupational History   Not on file  Tobacco Use   Smoking status: Former    Types: Cigarettes   Smokeless tobacco: Never   Vaping Use   Vaping status: Every Day  Substance and Sexual Activity   Alcohol use: Yes    Comment: occ   Drug use: Not Currently    Types: Marijuana    Comment: occ.   Sexual activity: Yes    Partners: Male  Other Topics Concern   Not on file  Social History Narrative   ** Merged History Encounter **       Social Drivers of Health   Financial Resource Strain: Low Risk  (02/12/2024)   Overall Financial Resource Strain (CARDIA)    Difficulty of Paying Living Expenses: Not very hard  Food Insecurity: No Food Insecurity (02/12/2024)   Hunger Vital Sign    Worried About Running Out of Food in the Last Year: Never true    Ran Out of Food in the Last Year: Never true  Transportation Needs: No Transportation Needs (02/12/2024)   PRAPARE - Administrator, Civil Service (Medical): No    Lack of Transportation (Non-Medical): No  Physical Activity: Insufficiently Active (02/12/2024)   Exercise Vital Sign    Days of Exercise per Week: 2 days    Minutes of Exercise per Session: 40 min  Stress: No Stress Concern Present (02/12/2024)   [employer] of Occupational Health - Occupational Stress Questionnaire    Feeling of Stress: Only a little  Social Connections: Socially Isolated (02/12/2024)   Social Connection and Isolation Panel    Frequency of Communication with Friends and Family: Twice a week    Frequency of Social Gatherings with Friends and Family: Never    Attends Religious Services: Never    Diplomatic Services operational officer: No    Attends Engineer, structural: Not on file    Marital Status: Married  Catering manager Violence: Not on file    Objective   BP 102/76   Pulse (!) 59   Ht 5' 9 (1.753 m)   Wt 158 lb 9.6 oz (71.9 kg)   LMP 12/16/2019   SpO2 100%   BMI 23.42 kg/m   Physical Exam  48 year old female in no acute distress Cardiovascular exam with regular rate and rhythm Lungs clear to auscultation bilaterally  Assessment &  Plan:   ANA positive Assessment & Plan: Has primarily had negative readings over the past few years.  She did have 1 positive reading a couple years ago.  This was low positive at that time.  Discussed with patient today.  Can proceed with retesting today as requested by patient  Orders: -     ANA  Food allergy Assessment & Plan: Report of mouth sensitivity with certain foods including jalapeno, some alcohol, suspected red wine.  Generally, symptoms described do seem somewhat atypical for true food allergy.  Given patient's concern, can proceed with  referral today for further evaluation with allergist  Orders: -     Ambulatory referral to Allergy  Cervical spine pain Assessment & Plan: Ongoing issue for patient.  She has had prior imaging with x-rays, however did not have MRI which was ordered by orthopedic surgeon previously.  She would like to reestablish with orthopedic surgery which I feel is appropriate, referral placed today  Orders: -     Ambulatory referral to Orthopedic Surgery  Lumbar spine pain Assessment & Plan: Known underlying degenerative disc disease, can proceed with referral to orthopedic surgeon as above  Orders: -     Ambulatory referral to Orthopedic Surgery  Return in about 4 months (around 06/15/2024).   Spent 48 minutes on this patient encounter, including preparation, chart review, face-to-face counseling with patient and coordination of care, and documentation of encounter   ___________________________________________ Ciara Kagan de [place], MD, ABFM, [facility]

## 2024-02-13 NOTE — Assessment & Plan Note (Addendum)
 Known underlying degenerative disc disease, can proceed with referral to orthopedic surgeon as above

## 2024-02-13 NOTE — Assessment & Plan Note (Signed)
 Ongoing issue for patient.  She has had prior imaging with x-rays, however did not have MRI which was ordered by orthopedic surgeon previously.  She would like to reestablish with orthopedic surgery which I feel is appropriate, referral placed today

## 2024-02-13 NOTE — Assessment & Plan Note (Signed)
 Has primarily had negative readings over the past few years.  She did have 1 positive reading a couple years ago.  This was low positive at that time.  Discussed with patient today.  Can proceed with retesting today as requested by patient

## 2024-02-14 LAB — ANA: Anti Nuclear Antibody (ANA): NEGATIVE

## 2024-03-03 ENCOUNTER — Ambulatory Visit
Admission: RE | Admit: 2024-03-03 | Discharge: 2024-03-03 | Disposition: A | Discharge status: Home / Self Care | Source: Ambulatory Visit | Attending: Obstetrics and Gynecology | Admitting: Obstetrics and Gynecology

## 2024-03-03 DIAGNOSIS — Z1231 Encounter for screening mammogram for malignant neoplasm of breast: Secondary | ICD-10-CM

## 2024-03-08 ENCOUNTER — Ambulatory Visit: Payer: Self-pay | Admitting: Obstetrics and Gynecology

## 2024-03-10 ENCOUNTER — Ambulatory Visit (HOSPITAL_BASED_OUTPATIENT_CLINIC_OR_DEPARTMENT_OTHER): Payer: Self-pay | Admitting: Family Medicine

## 2024-03-13 ENCOUNTER — Encounter: Payer: Self-pay | Admitting: Allergy & Immunology

## 2024-03-13 ENCOUNTER — Ambulatory Visit (INDEPENDENT_AMBULATORY_CARE_PROVIDER_SITE_OTHER): Admitting: Allergy & Immunology

## 2024-03-13 ENCOUNTER — Other Ambulatory Visit: Payer: Self-pay

## 2024-03-13 VITALS — BP 90/52 | HR 76 | Temp 97.6°F | Ht 67.75 in | Wt 158.8 lb

## 2024-03-13 DIAGNOSIS — J302 Other seasonal allergic rhinitis: Secondary | ICD-10-CM | POA: Diagnosis not present

## 2024-03-13 DIAGNOSIS — K121 Other forms of stomatitis: Secondary | ICD-10-CM

## 2024-03-13 DIAGNOSIS — J3089 Other allergic rhinitis: Secondary | ICD-10-CM | POA: Diagnosis not present

## 2024-03-13 NOTE — Progress Notes (Signed)
 NEW PATIENT  Date of Service/Encounter:  03/13/24  Consult requested by: de Peru, Raymond J, MD   Assessment:   Oral ulcer - concern for food allergy (although symptoms are not really entirely consistent with a food allergy)  Perennial and seasonal allergic rhinitis - not planning to test since symptoms seem well-controlled  Plan/Recommendations:   1. Oral lesions - We will get labs to look for a jalapeno allergy.  - We will look for a grape allergy.  - We will look for a mast cell disease with a tryptase which is probably normal. - Unfortunately, sulfites and alcohols are TOO SMALL to have testing that is useful.  - This might just be an intolerance to these items rather than a true food allergy. - You could try premedicating before consuming alcohol to see if that helps.   2. Perennial and seasonal allergic rhinitis  - We do not need to do repeat testing since your symptoms seem to be very stable.  - We could consider allergy shots for long term control.  - Information provided on this if you are interested.   3. Return if symptoms worsen or fail to improve. We can change that plan based on the testing.     This note in its entirety was forwarded to the Provider who requested this consultation.  Subjective:   Michelle Beltran is a 48 y.o. female presenting today for evaluation of  Chief Complaint  Patient presents with   Establish Care   Allergic Reaction    Jalapeno Alcohol or red wine   Angioedema   Rash   Pain    Michelle Beltran has a history of the following: Patient Active Problem List   Diagnosis Date Noted   Food allergy 02/13/2024   Pain in left ankle and joints of left foot 03/22/2023   Pain in right hand 03/22/2023   Diarrhea 06/13/2022   Need for hepatitis C screening test 06/13/2022   Cervical spine pain 04/19/2022   Lumbar spine pain 04/19/2022   Mild intermittent asthma without complication 05/11/2021   Premature  ovarian failure 05/11/2021   Hyperlipidemia with target LDL less than 160 05/11/2021   Visit for screening mammogram 07/21/2020   ANA positive 07/20/2020   Encounter for general adult medical examination with abnormal findings 07/20/2020    History obtained from: chart review and patient.  Discussed the use of AI scribe software for clinical note transcription with the patient and/or guardian, who gave verbal consent to proceed.  Michelle Beltran was referred by de Peru, Quintin PARAS, MD.     Michelle Beltran is a 48 y.o. female presenting for an evaluation of concern for food allergies.  She experiences recurrent oral ulcers, described as 'little white things' primarily under the tongue, which becomes very swollen. Initially, these episodes resolved within a week without intervention. However, a recent episode required a cortisone injection and a 10-day course of prednisone , along with Benadryl and doubled doses of Allegra every four hours. She experienced adverse mental effects from the prednisone  and has since left that practice.  Following this treatment, she consumed alcohol over three consecutive days, leading to a recurrence of the oral ulcers. She attempted self-treatment with baking soda mouthwashes and olive water, which helped reduce the symptoms. She suspects a sensitivity to alcohol, particularly red wine, as subsequent consumption of red wine in [place] led to a similar reaction. Antihistamines like Zyrtec helped resolve the symptoms within a week.  She has  not tried white wine or other alcoholic beverages since then but has experienced sensitivity after consuming foods containing wine, such as salami. She has also avoided acidic foods like tangerines and vinegar, though she reports no issues with tomatoes.  Her family history includes a sister with pet allergies, and she herself has allergies to cats, which can trigger asthma if she is exposed without premedication. She has previously  undergone allergy shots, which she reported did start to work, but she discontinued them due to pain after the shots and travel commitments. She travels frequently, which complicates consistent allergy treatment.  No throat swelling, coughing, stomach pain, or flushing with alcohol consumption. She reports no issues with tomatoes or vinegar.     Otherwise, there is no history of other atopic diseases, including drug allergies, stinging insect allergies, or contact dermatitis. There is no significant infectious history. Vaccinations are up to date.    Past Medical History: Patient Active Problem List   Diagnosis Date Noted   Food allergy 02/13/2024   Pain in left ankle and joints of left foot 03/22/2023   Pain in right hand 03/22/2023   Diarrhea 06/13/2022   Need for hepatitis C screening test 06/13/2022   Cervical spine pain 04/19/2022   Lumbar spine pain 04/19/2022   Mild intermittent asthma without complication 05/11/2021   Premature ovarian failure 05/11/2021   Hyperlipidemia with target LDL less than 160 05/11/2021   Visit for screening mammogram 07/21/2020   ANA positive 07/20/2020   Encounter for general adult medical examination with abnormal findings 07/20/2020    Medication List:  Allergies as of 03/13/2024       Reactions   Cat Dander    Allergic to cats and dogs        Medication List        Accurate as of March 13, 2024 11:59 PM. If you have any questions, ask your nurse or doctor.          albuterol  108 (90 Base) MCG/ACT inhaler Commonly known as: VENTOLIN  HFA Inhale 2 puffs into the lungs every 4 (four) hours as needed for wheezing.   CALCIUM PO Take by mouth daily.   EPINEPHrine  0.3 mg/0.3 mL Soaj injection Commonly known as: EPI-PEN Inject into the muscle.   estradiol  1 MG tablet Commonly known as: ESTRACE  Take 1 tablet (1 mg total) by mouth daily.   progesterone  100 MG capsule Commonly known as: PROMETRIUM  Take 1 capsule (100 mg total) by  mouth daily.   Vitamin D3 125 MCG (5000 UT) Caps Take by mouth. Take 5000 units 5 days weekly.   Cholecalciferol 125 MCG (5000 UT) capsule 1 capsule.   ZINC PO Take 1 tablet by mouth daily.        Birth History: non-contributory  Developmental History: non-contributory  Past Surgical History: Past Surgical History:  Procedure Laterality Date   BREAST SURGERY     Reduction   finger nerve reconstruction Left 2013   left hand, 4th digit.   INDUCED ABORTION  2005   REDUCTION MAMMAPLASTY Bilateral      Family History: Family History  Problem Relation Age of Onset   Osteoporosis Mother    Hypertension Mother    Hyperlipidemia Mother    Hypertension Father    Hyperlipidemia Father    Arthritis Father    Allergic rhinitis Sister    Heart disease Paternal Grandmother    Heart disease Paternal Grandfather      Social History: Belissa lives at home with her husband.  She has in a house that is 7 years old.  There is vinyl flooring throughout the home.  There is no mildew damage.  They have gas heating and central cooling.  There are no animals inside or outside of the home.  There are dust mite covers on the bed, but not the pillows.  There is vape exposure in house.  There is no vape or tobacco exposure in the car.  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  She does have a HEPA filter in the home.  She does not live near an interstate or industrial area.  She is from [place].    Review of systems otherwise negative other than that mentioned in the HPI.    Objective:   Blood pressure (!) 90/52, pulse 76, temperature 97.6 F (36.4 C), height 5' 7.75 (1.721 m), weight 158 lb 12.8 oz (72 kg), last menstrual period 12/16/2019, SpO2 98%. Body mass index is 24.32 kg/m.     Physical Exam Vitals reviewed.  Constitutional:      Appearance: She is well-developed.     Comments: Friendly.   HENT:     Head: Normocephalic and atraumatic.      Right Ear: Tympanic membrane, ear canal and external ear normal. No drainage, swelling or tenderness. Tympanic membrane is not injected, scarred, erythematous, retracted or bulging.     Left Ear: Tympanic membrane, ear canal and external ear normal. No drainage, swelling or tenderness. Tympanic membrane is not injected, scarred, erythematous, retracted or bulging.     Nose: No nasal deformity, septal deviation, mucosal edema or rhinorrhea.     Right Turbinates: Enlarged, swollen and pale.     Left Turbinates: Enlarged, swollen and pale.     Right Sinus: No maxillary sinus tenderness or frontal sinus tenderness.     Left Sinus: No maxillary sinus tenderness or frontal sinus tenderness.     Mouth/Throat:     Mouth: Mucous membranes are not pale and not dry.     Pharynx: Uvula midline.  Eyes:     General:        Right eye: No discharge.        Left eye: No discharge.     Conjunctiva/sclera: Conjunctivae normal.     Right eye: Right conjunctiva is not injected. No chemosis.    Left eye: Left conjunctiva is not injected. No chemosis.    Pupils: Pupils are equal, round, and reactive to light.  Cardiovascular:     Rate and Rhythm: Normal rate and regular rhythm.     Heart sounds: Normal heart sounds.  Pulmonary:     Effort: Pulmonary effort is normal. No tachypnea, accessory muscle usage or respiratory distress.     Breath sounds: Normal breath sounds. No wheezing, rhonchi or rales.  Chest:     Chest wall: No tenderness.  Abdominal:     Tenderness: There is no abdominal tenderness. There is no guarding or rebound.  Lymphadenopathy:     Head:     Right side of head: No submandibular, tonsillar or occipital adenopathy.     Left side of head: No submandibular, tonsillar or occipital adenopathy.     Cervical: No cervical adenopathy.  Skin:    Coloration: Skin is not pale.     Findings: No abrasion, erythema, petechiae or rash. Rash is not papular, urticarial or vesicular.  Neurological:      Mental Status: She is alert.  Psychiatric:  Behavior: Behavior is cooperative.      Diagnostic studies: labs sent instead      Marty Shaggy, MD [facility] 

## 2024-03-13 NOTE — Patient Instructions (Addendum)
 1. Oral lesions - We will get labs to look for a jalapeno allergy.  - We will look for a grape allergy.  - We will look for a mast cell disease with a tryptase which is probably normal. - Unfortunately, sulfites and alcohols are TOO SMALL to have testing that is useful.  - This might just be an intolerance to these items rather than a true food allergy. - You could try premedicating before consuming alcohol to see if that helps.   2. Perennial and seasonal allergic rhinitis  - We do not need to do repeat testing since your symptoms seem to be very stable.  - We could consider allergy shots for long term control.  - Information provided on this if you are interested.   3. Return if symptoms worsen or fail to improve. We can change that plan based on the testing.    Please inform us  of any Emergency Department visits, hospitalizations, or changes in symptoms. Call us  before going to the ED for breathing or allergy symptoms since we might be able to fit you in for a sick visit. Feel free to contact us  anytime with any questions, problems, or concerns.  It was a pleasure to meet you today!  Websites that have reliable patient information: 1. American Academy of Asthma, Allergy, and Immunology: www.aaaai.org 2. Food Allergy Research and Education (FARE): foodallergy.org 3. Mothers of Asthmatics: http://www.asthmacommunitynetwork.org 4. American College of Allergy, Asthma, and Immunology: www.acaai.org      "Like" us  on Facebook and Instagram for our latest updates!      A healthy democracy works best when [employer] participate! Make sure you are registered to vote! If you have moved or changed any of your contact information, you will need to get this updated before voting! Scan the QR codes below to learn more!     Allergy Shots  Allergies are the result of a chain reaction that starts in the immune system. Your immune system controls how your body defends itself. For instance,  if you have an allergy to pollen, your immune system identifies pollen as an invader or allergen. Your immune system overreacts by producing antibodies called Immunoglobulin E (IgE). These antibodies travel to cells that release chemicals, causing an allergic reaction.  The concept behind allergy immunotherapy, whether it is received in the form of shots or tablets, is that the immune system can be desensitized to specific allergens that trigger allergy symptoms. Although it requires time and patience, the payback can be long-term relief. Allergy injections contain a dilute solution of those substances that you are allergic to based upon your skin testing and allergy history.   How Do Allergy Shots Work?  Allergy shots work much like a vaccine. Your body responds to injected amounts of a particular allergen given in increasing doses, eventually developing a resistance and tolerance to it. Allergy shots can lead to decreased, minimal or no allergy symptoms.  There generally are two phases: build-up and maintenance. Build-up often ranges from three to six months and involves receiving injections with increasing amounts of the allergens. The shots are typically given once or twice a week, though more rapid build-up schedules are sometimes used.  The maintenance phase begins when the most effective dose is reached. This dose is different for each person, depending on how allergic you are and your response to the build-up injections. Once the maintenance dose is reached, there are longer periods between injections, typically two to four weeks.  Occasionally doctors give  cortisone-type shots that can temporarily reduce allergy symptoms. These types of shots are different and should not be confused with allergy immunotherapy shots.  Who Can Be Treated with Allergy Shots?  Allergy shots may be a good treatment approach for people with allergic rhinitis (hay fever), allergic asthma, conjunctivitis (eye  allergy) or stinging insect allergy.   Before deciding to begin allergy shots, you should consider:   The length of allergy season and the severity of your symptoms  Whether medications and/or changes to your environment can control your symptoms  Your desire to avoid long-term medication use  Time: allergy immunotherapy requires a major time commitment  Cost: may vary depending on your insurance coverage  Allergy shots for children age 68 and older are effective and often well tolerated. They might prevent the onset of new allergen sensitivities or the progression to asthma.  Allergy shots are not started on patients who are pregnant but can be continued on patients who become pregnant while receiving them. In some patients with other medical conditions or who take certain common medications, allergy shots may be of risk. It is important to mention other medications you talk to your allergist.   What are the two types of build-ups offered:   RUSH or Rapid Desensitization -- one day of injections lasting from 8:30-4:30pm, injections every 1 hour.  Approximately half of the build-up process is completed in that one day.  The following week, normal build-up is resumed, and this entails ~16 visits either weekly or twice weekly, until reaching your "maintenance dose" which is continued weekly until eventually getting spaced out to every month for a duration of 3 to 5 years. The regular build-up appointments are nurse visits where the injections are administered, followed by required monitoring for 30 minutes.    Traditional build-up -- weekly visits for 6 -12 months until reaching "maintenance dose", then continue weekly until eventually spacing out to every 4 weeks as above. At these appointments, the injections are administered, followed by required monitoring for 30 minutes.     Either way is acceptable, and both are equally effective. With the rush protocol, the advantage is that less time is  spent here for injections overall AND you would also reach maintenance dosing faster (which is when the clinical benefit starts to become more apparent). Not everyone is a candidate for rapid desensitization.   IF we proceed with the RUSH protocol, there are premedications which must be taken the day before and the day after the rush only (this includes antihistamines, steroids, and Singulair).  After the rush day, no prednisone  or Singulair is required, and we just recommend antihistamines taken on your injection day.  What Is An Estimate of the Costs?  If you are interested in starting allergy injections, please check with your insurance company about your coverage for both allergy vial sets and allergy injections.  Please do so prior to making the appointment to start injections.  The following are CPT codes to give to your insurance company. These are the amounts we BILL to the insurance company, but the amount YOU WILL PAY and WE RECEIVE IS SUBSTANTIALLY LESS and depends on the contracts we have with different insurance companies.   Amount Billed to Insurance One allergy vial set  CPT 95165   $ 1200     Two allergy vial set  CPT 95165   $ 2400     Three allergy vial set  CPT 95165   $ 3600  One injection   CPT 95115   $ 35  Two injections   CPT 95117   $ 40 RUSH (Rapid Desensitization) CPT 95180 x 8 hours $500/hour  Regarding the allergy injections, your co-pay may or may not apply with each injection, so please confirm this with your insurance company. When you start allergy injections, 1 or 2 sets of vials are made based on your allergies.  Not all patients can be on one set of vials. A set of vials lasts 6 months to a year depending on how quickly you can proceed with your build-up of your allergy injections. Vials are personalized for each patient depending on their specific allergens.  How often are allergy injection given during the build-up period?   Injections are given at least  weekly during the build-up period until your maintenance dose is achieved. Per the doctor's discretion, you may have the option of getting allergy injections two times per week during the build-up period. However, there must be at least 48 hours between injections. The build-up period is usually completed within 6-12 months depending on your ability to schedule injections and for adjustments for reactions. When maintenance dose is reached, your injection schedule is gradually changed to every two weeks and later to every three weeks. Injections will then continue every 4 weeks. Usually, injections are continued for a total of 3-5 years.   When Will I Feel Better?  Some may experience decreased allergy symptoms during the build-up phase. For others, it may take as long as 12 months on the maintenance dose. If there is no improvement after a year of maintenance, your allergist will discuss other treatment options with you.  If you aren't responding to allergy shots, it may be because there is not enough dose of the allergen in your vaccine or there are missing allergens that were not identified during your allergy testing. Other reasons could be that there are high levels of the allergen in your environment or major exposure to non-allergic triggers like tobacco smoke.  What Is the Length of Treatment?  Once the maintenance dose is reached, allergy shots are generally continued for three to five years. The decision to stop should be discussed with your allergist at that time. Some people may experience a permanent reduction of allergy symptoms. Others may relapse and a longer course of allergy shots can be considered.  What Are the Possible Reactions?  The two types of adverse reactions that can occur with allergy shots are local and systemic. Common local reactions include very mild redness and swelling at the injection site, which can happen immediately or several hours after. Report a delayed reaction  from your last injection. These include arm swelling or runny nose, watery eyes or cough that occurs within 12-24 hours after injection. A systemic reaction, which is less common, affects the entire body or a particular body system. They are usually mild and typically respond quickly to medications. Signs include increased allergy symptoms such as sneezing, a stuffy nose or hives.   Rarely, a serious systemic reaction called anaphylaxis can develop. Symptoms include swelling in the throat, wheezing, a feeling of tightness in the chest, nausea or dizziness. Most serious systemic reactions develop within 30 minutes of allergy shots. This is why it is strongly recommended you wait in your doctor's office for 30 minutes after your injections. Your allergist is trained to watch for reactions, and his or her staff is trained and equipped with the proper medications to identify and treat  them.   Report to the nurse immediately if you experience any of the following symptoms: swelling, itching or redness of the skin, hives, watery eyes/nose, breathing difficulty, excessive sneezing, coughing, stomach pain, diarrhea, or light headedness. These symptoms may occur within 15-20 minutes after injection and may require medication.   Who Should Administer Allergy Shots?  The preferred location for receiving shots is your prescribing allergist's office. Injections can sometimes be given at another facility where the physician and staff are trained to recognize and treat reactions, and have received instructions by your prescribing allergist.  What if I am late for an injection?   Injection dose will be adjusted depending upon how many days or weeks you are late for your injection.   What if I am sick?   Please report any illness to the nurse before receiving injections. She may adjust your dose or postpone injections depending on your symptoms. If you have fever, flu, sinus infection or chest congestion it is best  to postpone allergy injections until you are better. Never get an allergy injection if your asthma is causing you problems. If your symptoms persist, seek out medical care to get your health problem under control.  What If I am or Become Pregnant:  Women that become pregnant should schedule an appointment with [facility] before receiving any further allergy injections.   Food Intolerance vs Food Sensitization vs Food Allergy  Some of the symptoms of food intolerance and food allergy are similar, but the differences between the two are very important. Eating a food you are intolerant to can leave you feeling miserable. However, if you have a true food allergy, your body's reaction to this food could be life-threatening.  Allergy: food allergy is when you have eaten a food, developed an allergic reaction after eating the food and have IgE to the food (positive food testing either by skin testing or blood testing).  Food allergy could lead to life threatening symptoms  Sensitivity: occurs when you have IgE to a food (positive food testing either by skin testing or blood testing) but is a food you eat without any issues.  This is not an allergy and we recommend keeping the food in the diet  Intolerance: this is when you have negative testing by either skin testing or blood testing thus not allergic but the food causes symptoms (like belly pain, bloating, diarrhea etc) with ingestion.  These foods should be avoided to prevent symptoms.    Digestive system versus immune system  A food intolerance response takes place in the digestive system. It occurs when you are unable to properly breakdown the food. This could be due to enzyme deficiencies, sensitivity to food additives or reactions to naturally occurring chemicals in foods. Often, people can eat small amounts of the food without causing problems.  A food allergic reaction involves the immune system. Your immune system controls  how your body defends itself. For instance, if you have an allergy to cow's milk, your immune system identifies cow's milk as an invader or allergen. Your immune system overreacts by producing antibodies called Immunoglobulin E (IgE). These antibodies travel to cells that release chemicals, causing an allergic reaction. Each type of IgE has a specific "radar" for each type of allergen.  Unlike an intolerance to food, a food allergy can cause a serious or even life-threatening reaction by eating a microscopic amount, touching or inhaling the food.  Symptoms of allergic reactions to foods are generally seen on  the skin (hives, itchiness, swelling of the skin). Gastrointestinal symptoms may include vomiting and diarrhea. Respiratory symptoms may accompany skin and gastrointestinal symptoms, but don't usually occur alone.  Anaphylaxis is a serious allergic reaction that happens very quickly. Symptoms of anaphylaxis may include difficulty breathing, dizziness or loss of consciousness. Without immediate treatment--an injection of epinephrine  (adrenalin ) and expert care--anaphylaxis can be fatal.  To the Point: there is a very serious difference between being INTOLERANT to a food and being ALLERGIC to a food.      Regarding Food IgG Testing: In IgG testing, the blood is tested for IgG antibodies instead of being tested for IgE antibodies (i.e., the antibodies typically associated with food allergies). The existence of serum IgG antibodies towards particular foods is claimed by many practitioners as a tool to diagnose food allergy or intolerance. The problem with this is that IgG is a "memory antibody." IgG signifies exposure to a food, not allergy to a food. Since a normal immune system should make IgG antibodies to foreign proteins, a positive IgG test to a food is a sign of a normal immune system. In fact, a positive result can actually indicate tolerance for the food, not intolerance. There is no scientific  evidence to support IgG testing for the diagnosis of food allergies.

## 2024-03-18 ENCOUNTER — Encounter: Payer: Self-pay | Admitting: Allergy & Immunology

## 2024-03-18 LAB — JALAPENO PEPPER, IGE
Class Interpretation: 0
Jalapeno Pepper: <0.35 kU/L (ref <0.35)

## 2024-03-18 LAB — TRYPTASE: Tryptase: 3.9 ug/L (ref 2.2–13.2)

## 2024-03-18 LAB — ALLERGEN GRAPE F259: Allergen Grape IgE: <0.1 kU/L

## 2024-03-21 ENCOUNTER — Ambulatory Visit: Payer: Self-pay | Admitting: Allergy & Immunology

## 2024-05-19 ENCOUNTER — Encounter: Payer: Self-pay | Admitting: Radiology

## 2024-06-16 ENCOUNTER — Encounter (HOSPITAL_BASED_OUTPATIENT_CLINIC_OR_DEPARTMENT_OTHER): Payer: Self-pay | Admitting: Family Medicine

## 2024-06-16 ENCOUNTER — Ambulatory Visit (HOSPITAL_BASED_OUTPATIENT_CLINIC_OR_DEPARTMENT_OTHER): Admitting: Family Medicine

## 2024-06-16 VITALS — BP 99/63 | HR 61 | Temp 98.3°F | Resp 18 | Ht 67.75 in | Wt 159.0 lb

## 2024-06-16 DIAGNOSIS — M545 Low back pain, unspecified: Secondary | ICD-10-CM | POA: Diagnosis not present

## 2024-06-16 DIAGNOSIS — M542 Cervicalgia: Secondary | ICD-10-CM

## 2024-06-16 DIAGNOSIS — N951 Menopausal and female climacteric states: Secondary | ICD-10-CM | POA: Insufficient documentation

## 2024-06-16 DIAGNOSIS — Z0000 Encounter for general adult medical examination without abnormal findings: Secondary | ICD-10-CM | POA: Diagnosis not present

## 2024-06-16 DIAGNOSIS — R1310 Dysphagia, unspecified: Secondary | ICD-10-CM | POA: Insufficient documentation

## 2024-06-16 DIAGNOSIS — R140 Abdominal distension (gaseous): Secondary | ICD-10-CM | POA: Insufficient documentation

## 2024-06-16 DIAGNOSIS — K219 Gastro-esophageal reflux disease without esophagitis: Secondary | ICD-10-CM | POA: Insufficient documentation

## 2024-06-16 DIAGNOSIS — M5090 Cervical disc disorder, unspecified, unspecified cervical region: Secondary | ICD-10-CM | POA: Insufficient documentation

## 2024-06-16 DIAGNOSIS — M51369 Other intervertebral disc degeneration, lumbar region without mention of lumbar back pain or lower extremity pain: Secondary | ICD-10-CM | POA: Insufficient documentation

## 2024-06-16 NOTE — Assessment & Plan Note (Signed)
 Known underlying degenerative disc disease, we did refer her to orthopedic surgeon at prior visit.  She indicates that she has been unable to reach orthopedist office.  She notes that she has called the office, however has not received a return call. We discussed considerations, I do feel that she would benefit from further evaluation with Ortho, new referral placed to assist with this.

## 2024-06-16 NOTE — Assessment & Plan Note (Signed)
 Ongoing issue for patient.  She has had prior imaging with x-rays, however did not have MRI which was ordered by orthopedic surgeon previously.  She would like to reestablish with orthopedic surgery which I feel is appropriate - as discussed separately, she has had issues getting in touch with orthopedic surgeons office.  New referral placed to assist with this

## 2024-06-16 NOTE — Progress Notes (Signed)
    Procedures performed today:    None.  Independent interpretation of notes and tests performed by another provider:   None.  Brief History, Exam, Impression, and Recommendations:    BP 99/63 (BP Location: Left Arm, Patient Position: Sitting, Cuff Size: Normal)   Pulse 61   Temp 98.3 F (36.8 C) (Oral)   Resp 18   Ht 5' 7.75 (1.721 m)   Wt 159 lb (72.1 kg)   LMP 12/16/2019   SpO2 97%   BMI 24.35 kg/m   Patient has had evaluation with allergist.  Additional testing revealed no specific allergy to foods which were triggering oral symptoms/lesions. We discussed considerations related to oral symptoms.  Would recommend further evaluation with general dentist initially given testing with allergist which is reassuring.  Cervical spine pain Assessment & Plan: Ongoing issue for patient.  She has had prior imaging with x-rays, however did not have MRI which was ordered by orthopedic surgeon previously.  She would like to reestablish with orthopedic surgery which I feel is appropriate - as discussed separately, she has had issues getting in touch with orthopedic surgeons office.  New referral placed to assist with this  Orders: -     Ambulatory referral to Orthopedic Surgery  Lumbar spine pain Assessment & Plan: Known underlying degenerative disc disease, we did refer her to orthopedic surgeon at prior visit.  She indicates that she has been unable to reach orthopedist office.  She notes that she has called the office, however has not received a return call. We discussed considerations, I do feel that she would benefit from further evaluation with Ortho, new referral placed to assist with this.  Orders: -     Ambulatory referral to Orthopedic Surgery  Wellness examination -     CBC with Differential/Platelet; Future -     Comprehensive metabolic panel with GFR; Future -     Lipid panel; Future -     TSH Rfx on Abnormal to Free T4; Future  Return in about 6 months (around  12/15/2024) for CPE with fasting labs 1 week prior.   ___________________________________________ Torri Langston de [place], MD, ABFM, CAQSM [facility]

## 2024-08-07 ENCOUNTER — Encounter: Admitting: Orthopedic Surgery

## 2024-09-11 ENCOUNTER — Encounter: Admitting: Orthopedic Surgery

## 2024-11-18 ENCOUNTER — Encounter (HOSPITAL_BASED_OUTPATIENT_CLINIC_OR_DEPARTMENT_OTHER): Admitting: Family Medicine
